# Patient Record
Sex: Female | Born: 1970 | Hispanic: Yes | Marital: Married | Smoking: Never smoker
Health system: Southern US, Community
[De-identification: ages and names within clinical notes are randomized; demographics above are authoritative.]

## PROBLEM LIST (undated history)

## (undated) DIAGNOSIS — E785 Hyperlipidemia, unspecified: Secondary | ICD-10-CM

## (undated) DIAGNOSIS — E119 Type 2 diabetes mellitus without complications: Secondary | ICD-10-CM

## (undated) HISTORY — DX: Type 2 diabetes mellitus without complications: E11.9

## (undated) HISTORY — DX: Hyperlipidemia, unspecified: E78.5

---

## 1992-11-02 HISTORY — PX: BREAST SURGERY: SHX581

## 1998-11-02 HISTORY — PX: ABDOMINAL HYSTERECTOMY: SHX81

## 2005-12-09 ENCOUNTER — Ambulatory Visit: Payer: Self-pay | Admitting: Unknown Physician Specialty

## 2006-01-15 ENCOUNTER — Ambulatory Visit: Payer: Self-pay | Admitting: Unknown Physician Specialty

## 2006-02-11 ENCOUNTER — Ambulatory Visit: Payer: Self-pay | Admitting: Gastroenterology

## 2007-01-19 ENCOUNTER — Ambulatory Visit: Payer: Self-pay | Admitting: General Surgery

## 2007-01-26 ENCOUNTER — Ambulatory Visit: Payer: Self-pay | Admitting: General Surgery

## 2007-06-16 ENCOUNTER — Ambulatory Visit: Payer: Self-pay | Admitting: Gastroenterology

## 2007-06-30 IMAGING — US ABDOMEN ULTRASOUND
1 series · 17 of 25 positions shown · non-contrast
Comparison: none

REASON FOR EXAM: Elevated LFT
COMMENTS:

[Series 1: abdomen ultrasound · 17 of 54 slices shown]
[im 1/54]
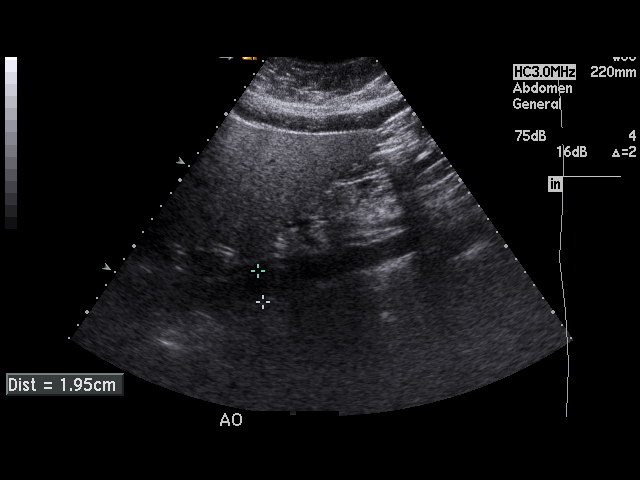
[im 5/54]
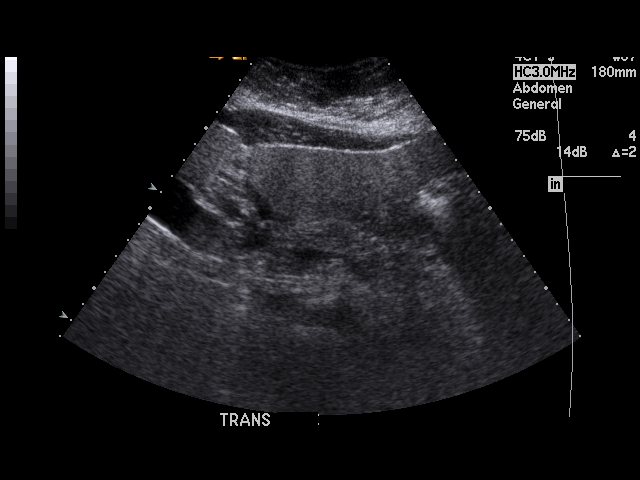
[im 7/54]
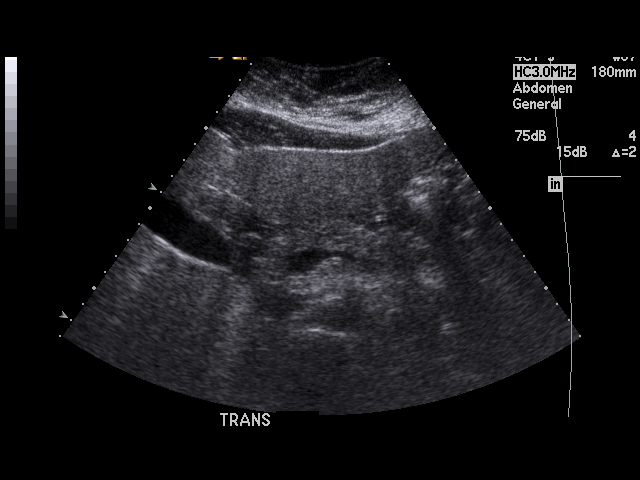
[im 12/54]
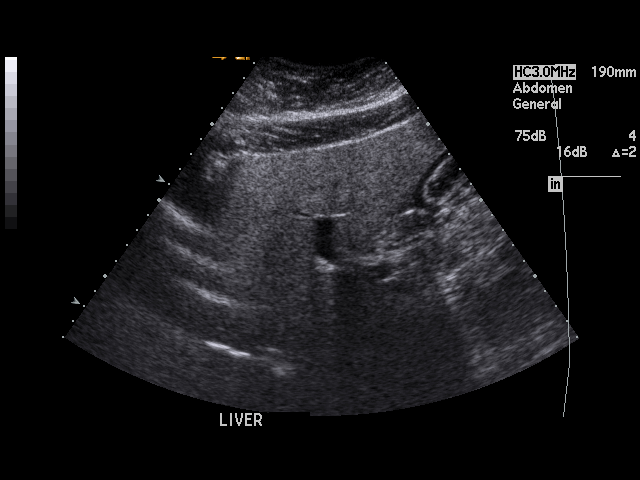
[im 14/54]
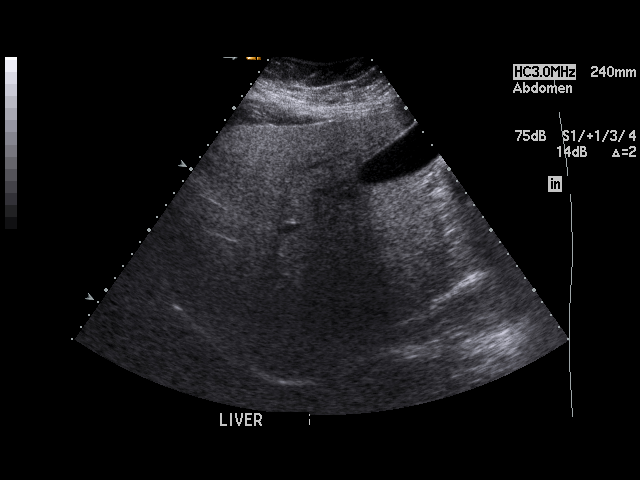
[im 18/54]
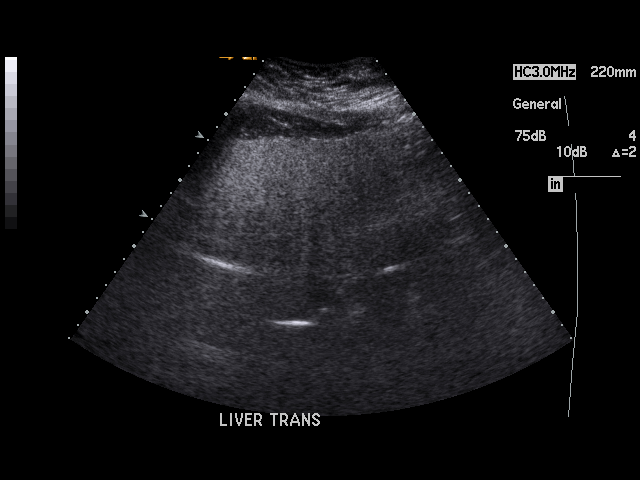
[im 20/54]
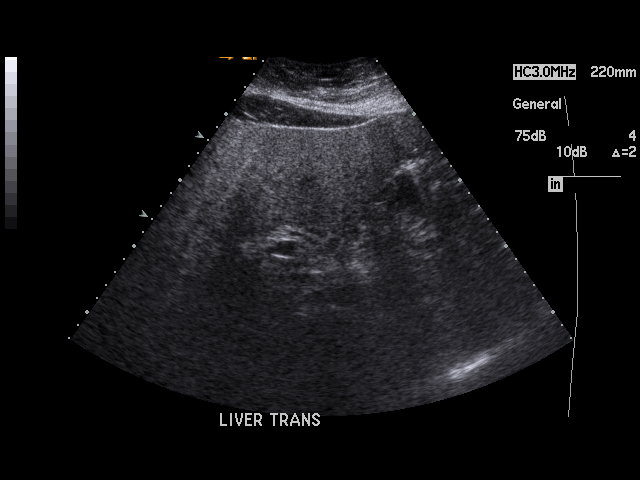
[im 25/54]
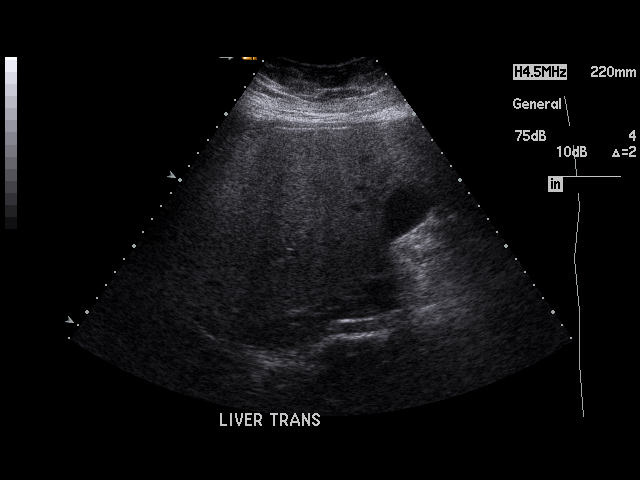
[im 27/54]
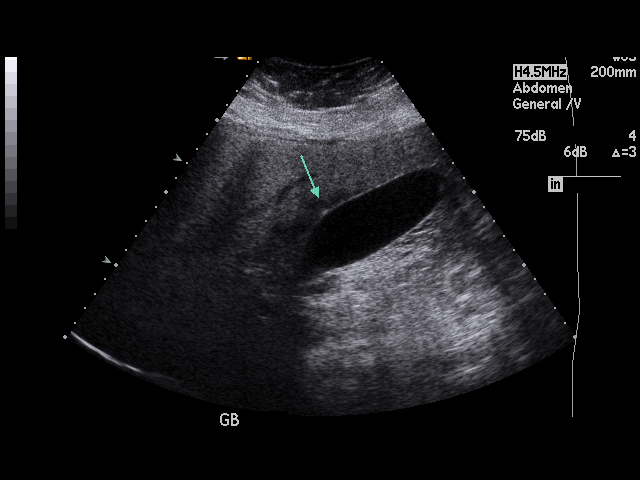
[im 29/54]
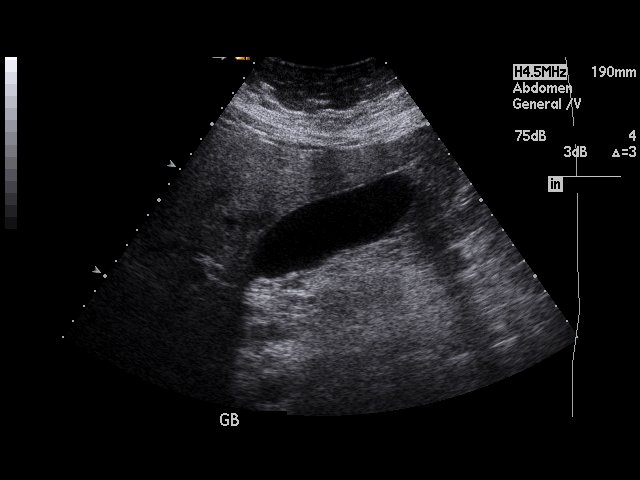
[im 34/54]
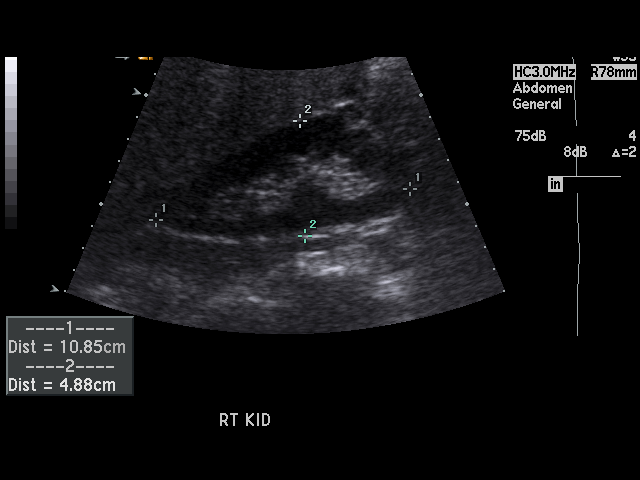
[im 36/54]
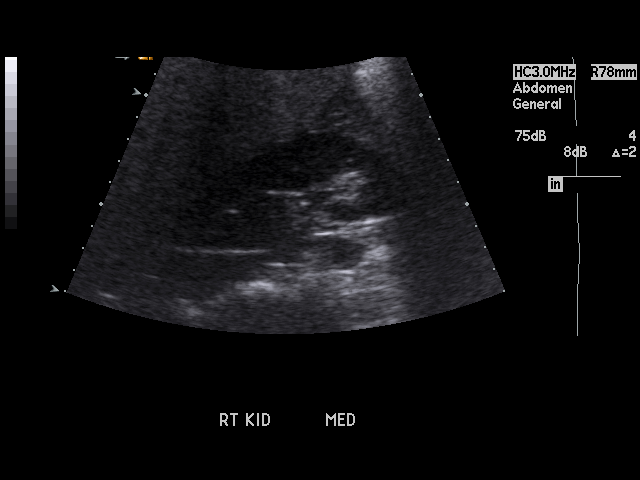
[im 40/54]
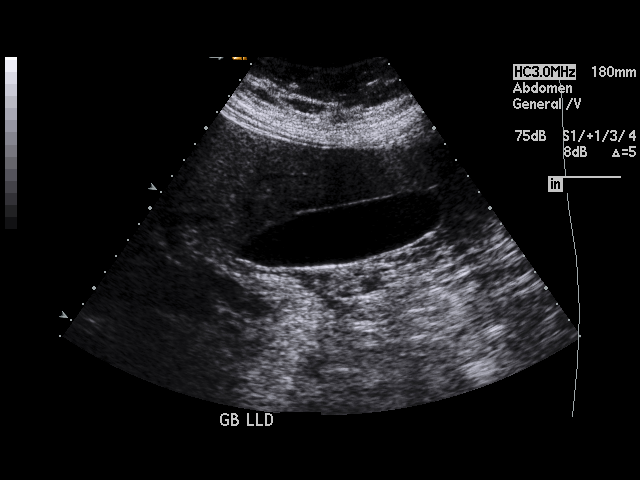
[im 42/54]
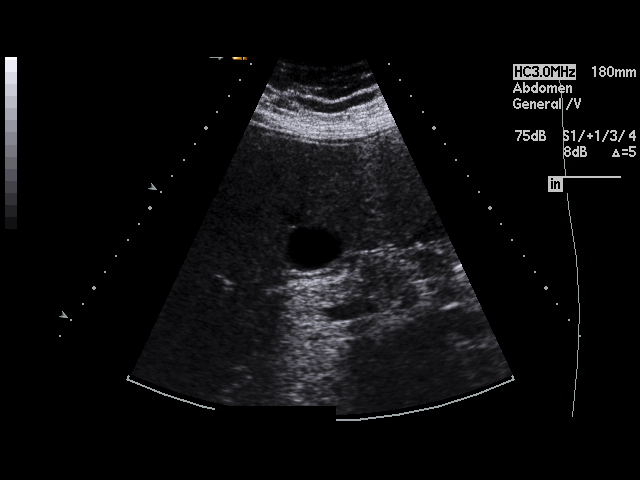
[im 47/54]
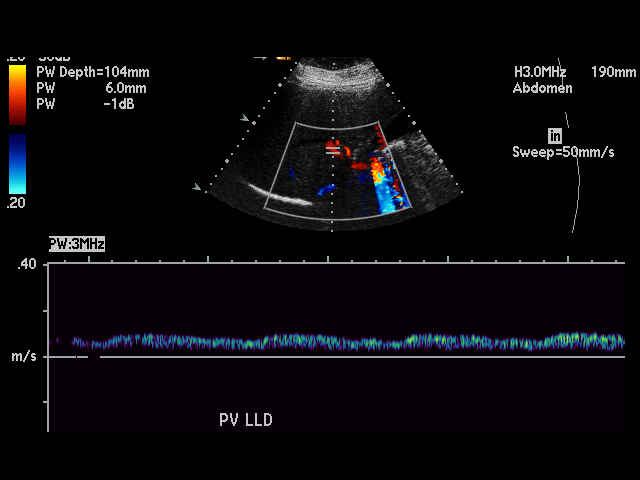
[im 49/54]
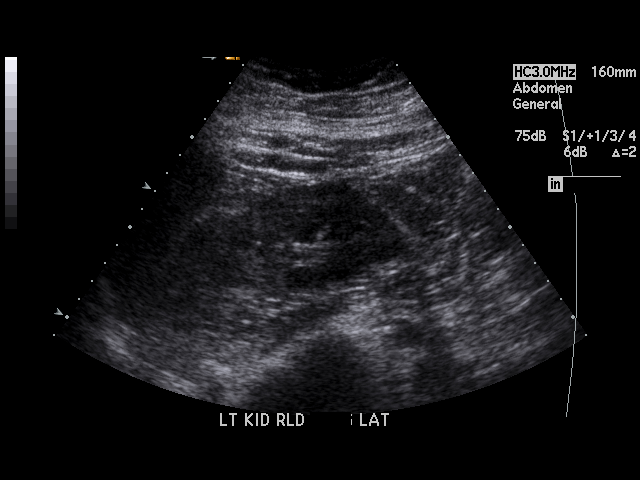
[im 54/54]
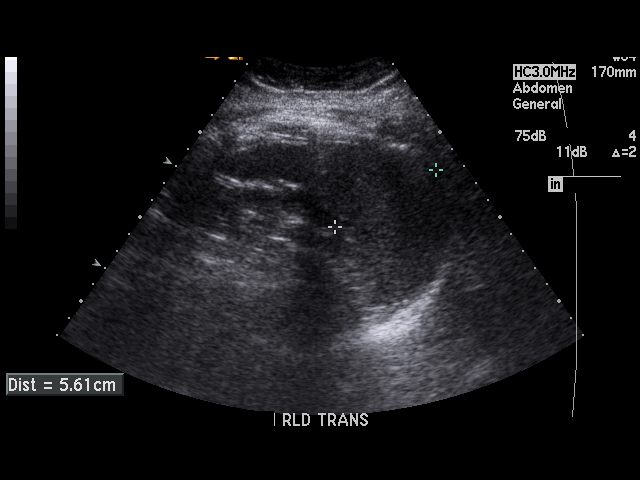

[17 of 25 positions shown; findings below may reference images not displayed]

PROCEDURE:     US  - US ABDOMEN GENERAL SURVEY  - February 11, 2006  [DATE]

RESULT:        The liver is dense, suspicious for fatty infiltration.  The
pancreas is normal in appearance.  The spleen size is normal.  No gallstones
are seen.  There is no thickening of the gallbladder wall.  The common bile
duct measures 3.5 mm in diameter which is within normal limits.  The kidneys
show no hydronephrosis.  The abdominal aorta is normal in appearance.
IMPRESSION: 1.     Possibly fatty infiltration of the liver.
2.     Otherwise normal study.

## 2008-11-01 IMAGING — US ABDOMEN ULTRASOUND
1 series · 17 of 25 positions shown · non-contrast
Comparison: none

REASON FOR EXAM: Elevated LFT's
COMMENTS:

[Series 1: abdomen ultrasound · 17 of 60 slices shown]
[im 1/60]
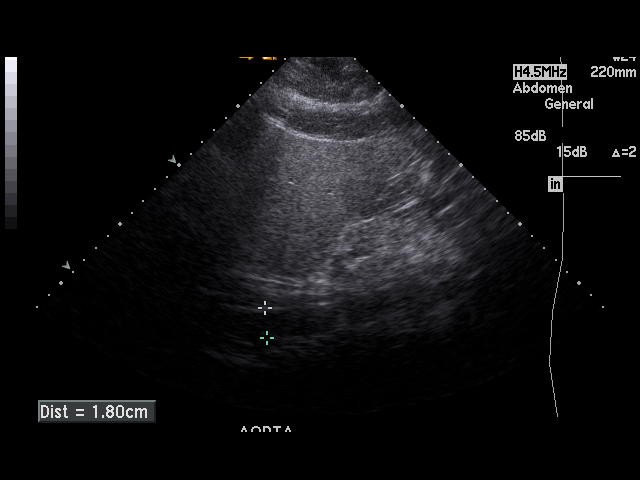
[im 5/60]
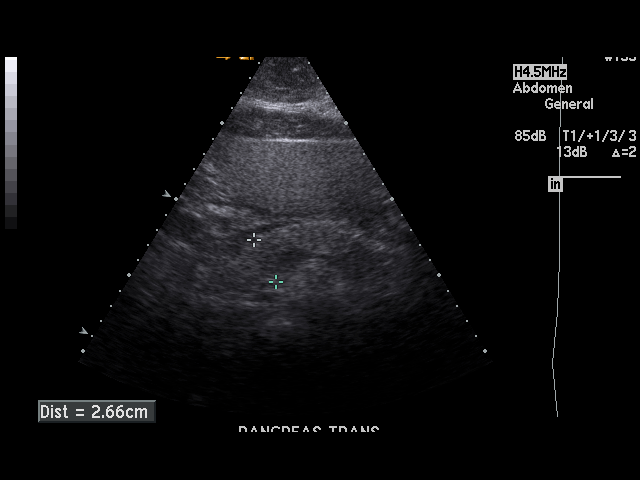
[im 8/60]
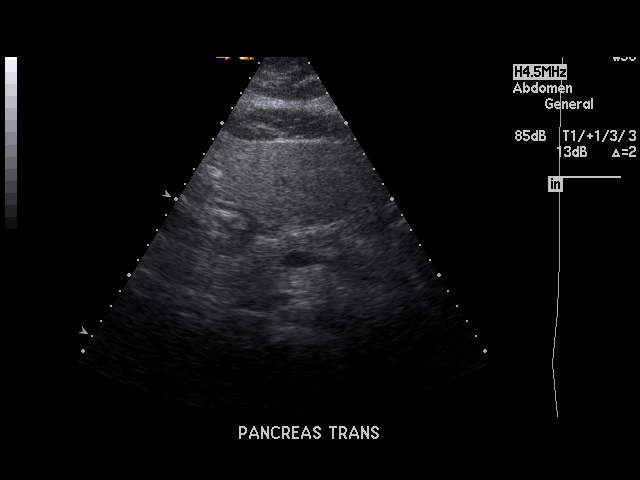
[im 13/60]
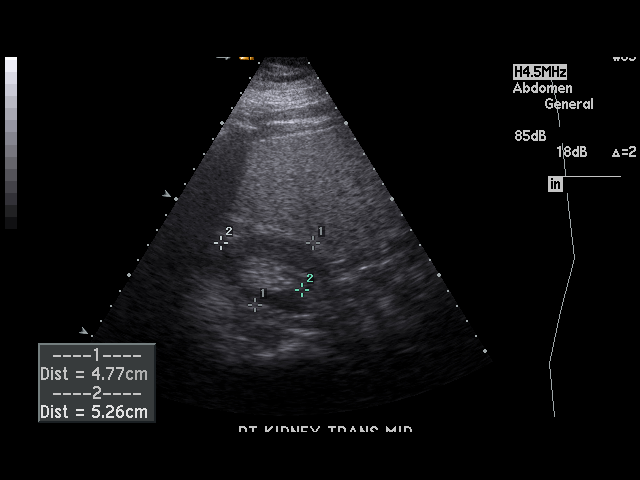
[im 15/60]
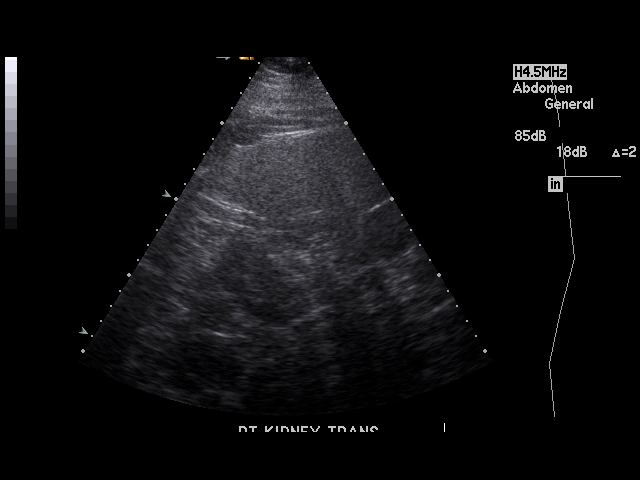
[im 20/60]
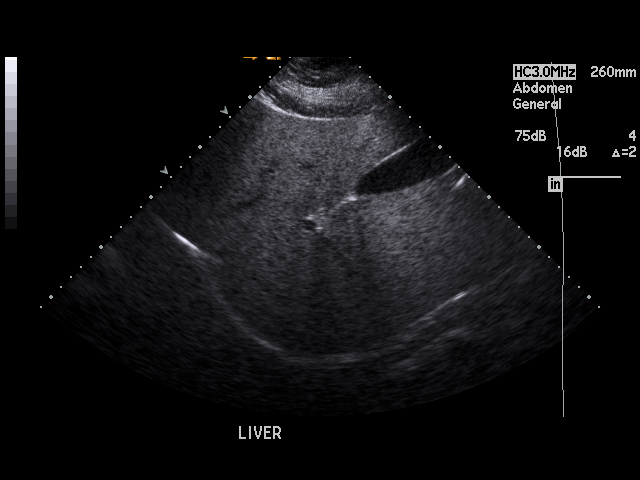
[im 23/60]
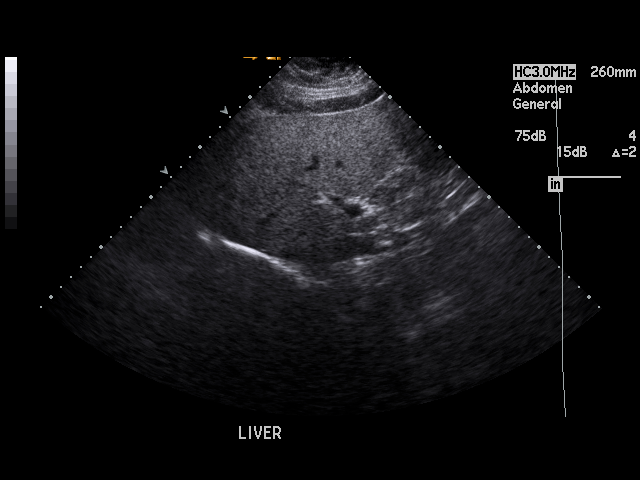
[im 28/60]
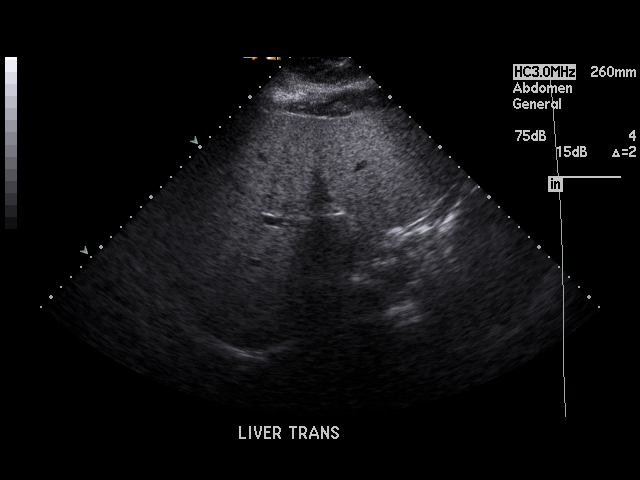
[im 30/60]
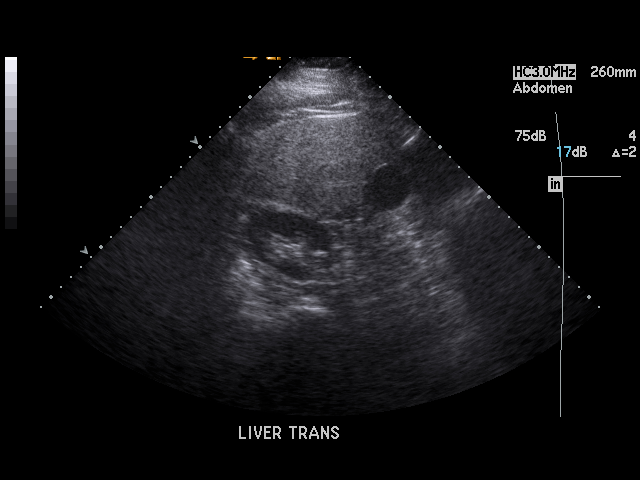
[im 32/60]
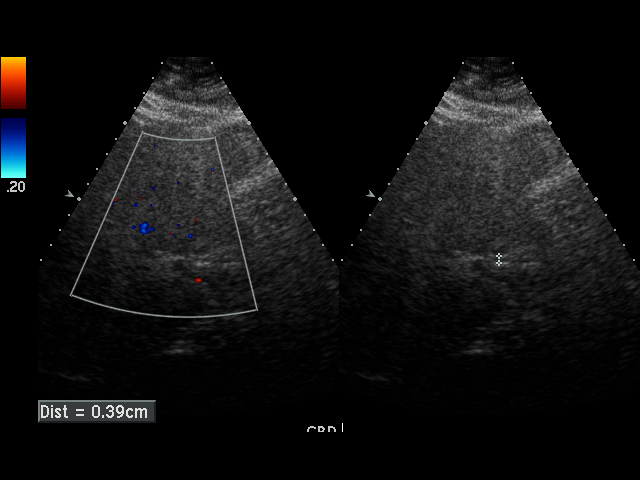
[im 37/60]
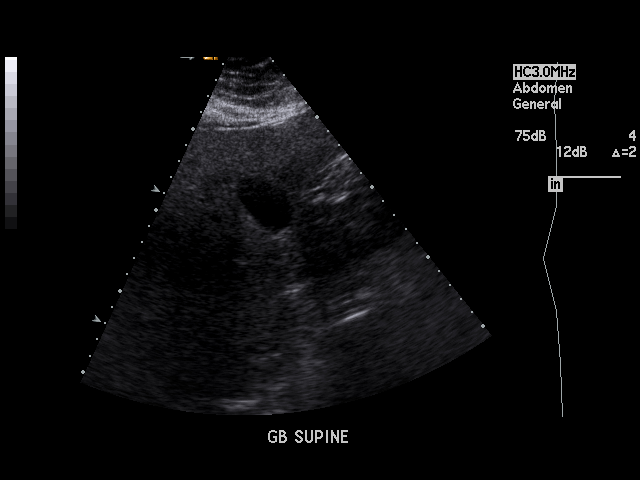
[im 40/60]
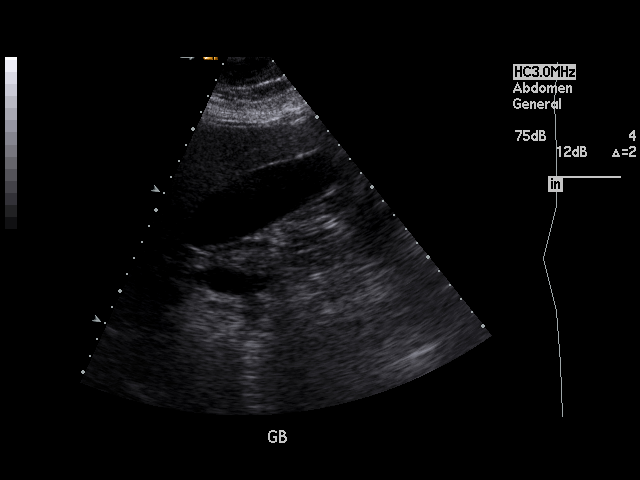
[im 45/60]
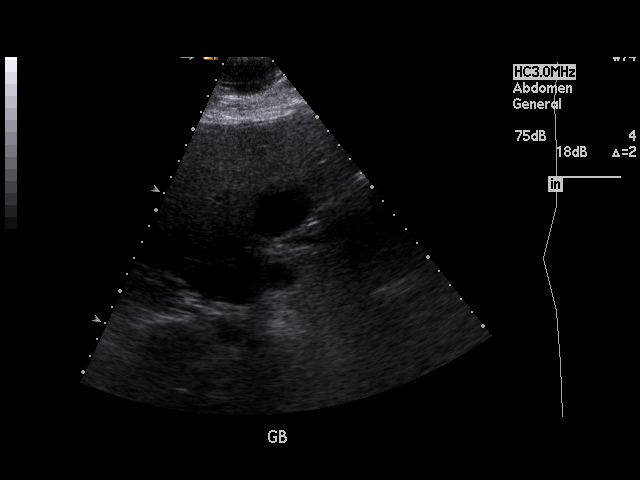
[im 47/60]
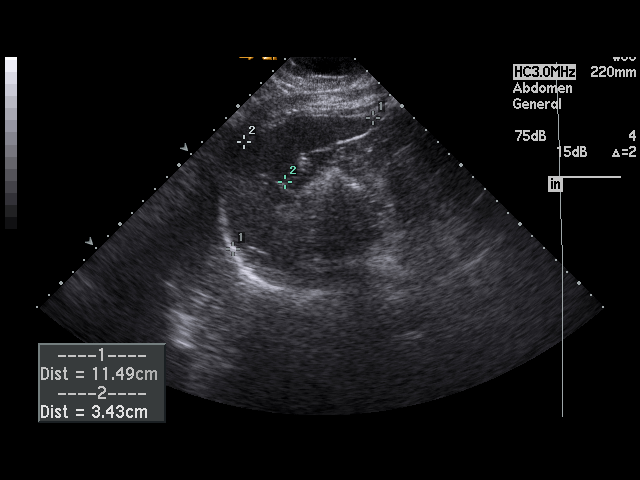
[im 52/60]
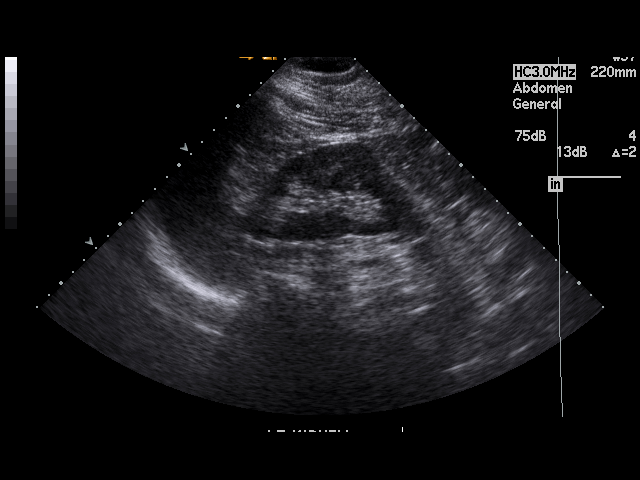
[im 55/60]
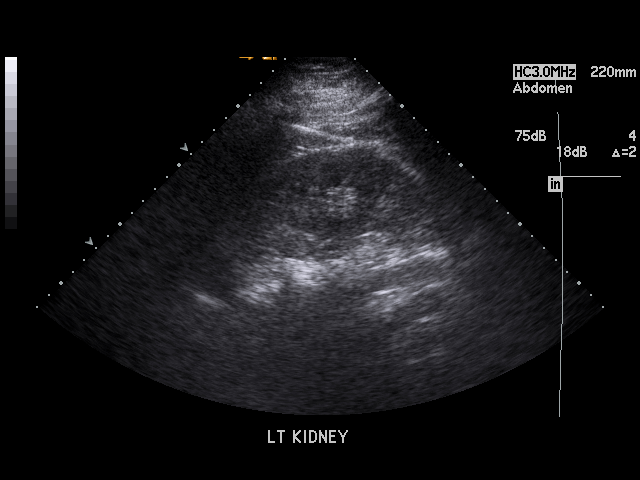
[im 60/60]
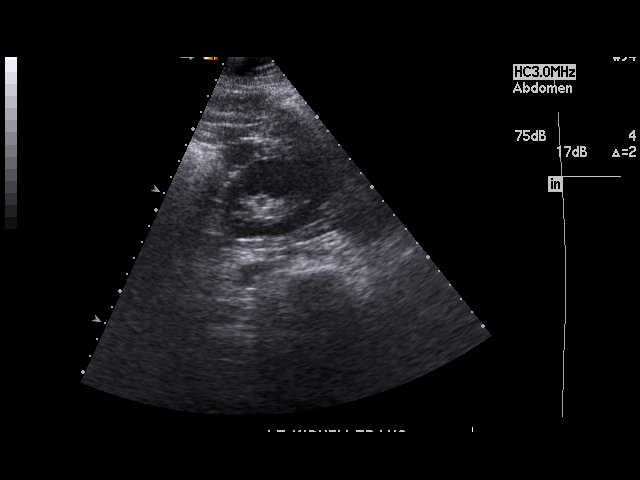

[17 of 25 positions shown; findings below may reference images not displayed]

PROCEDURE:     US  - US ABDOMEN GENERAL SURVEY  - June 16, 2007  [DATE]

RESULT:     The hepatic echo pattern is dense suspicious for fatty
infiltration and similar to the appearance observed on the prior exam of
02/11/2006. No focal hepatic mass lesions are seen. The spleen, pancreas and
abdominal aorta show no significant abnormalities. No gallstones are seen.
There is no thickening of the gallbladder wall. The common bile duct
measures 3.9 mm in diameter which is within normal limits. The kidneys show
no hydronephrosis. There is no ascites.
IMPRESSION: 1.  Probable fatty infiltration of the liver.
2.  No gallstones or other acute changes are identified.

## 2008-11-02 HISTORY — PX: SKIN BIOPSY: SHX1

## 2013-01-27 DIAGNOSIS — Z9884 Bariatric surgery status: Secondary | ICD-10-CM | POA: Insufficient documentation

## 2014-07-10 ENCOUNTER — Emergency Department: Payer: Self-pay | Admitting: Emergency Medicine

## 2014-08-16 DIAGNOSIS — R928 Other abnormal and inconclusive findings on diagnostic imaging of breast: Secondary | ICD-10-CM | POA: Insufficient documentation

## 2015-11-03 HISTORY — PX: LAPAROSCOPIC GASTRIC BAND REMOVAL WITH LAPAROSCOPIC GASTRIC SLEEVE RESECTION: SHX6498

## 2019-01-17 ENCOUNTER — Ambulatory Visit: Payer: Self-pay | Admitting: Podiatry

## 2019-07-07 ENCOUNTER — Encounter: Payer: Self-pay | Admitting: Podiatry

## 2019-07-07 ENCOUNTER — Other Ambulatory Visit: Payer: Self-pay

## 2019-07-07 ENCOUNTER — Ambulatory Visit (INDEPENDENT_AMBULATORY_CARE_PROVIDER_SITE_OTHER): Payer: BC Managed Care – PPO

## 2019-07-07 ENCOUNTER — Ambulatory Visit (INDEPENDENT_AMBULATORY_CARE_PROVIDER_SITE_OTHER): Payer: BC Managed Care – PPO | Admitting: Podiatry

## 2019-07-07 VITALS — BP 120/66 | HR 64 | Resp 16

## 2019-07-07 DIAGNOSIS — M722 Plantar fascial fibromatosis: Secondary | ICD-10-CM

## 2019-07-07 MED ORDER — METHYLPREDNISOLONE 4 MG PO TBPK: ORAL_TABLET | ORAL | 0 refills | Status: DC

## 2019-07-07 MED ORDER — MELOXICAM 15 MG PO TABS: 15.0000 mg | ORAL_TABLET | Freq: Every day | ORAL | 1 refills | Status: DC

## 2019-07-07 NOTE — Patient Instructions (Signed)

## 2019-07-10 NOTE — Progress Notes (Signed)
   Subjective: 48 y.o. female presenting today as a new patient with a chief complaint of aching pain noted to the right plantar heel that began 8 months ago. She reports associated radiating pain to the lateral foot and ankle. She states the pain is worse in the morning and standing and walking increases it throughout the day. She has been using ice therapy, soaking the foot, applying BenGay and using compression socks for treatment. Patient is here for further evaluation and treatment.    No past medical history on file.   Objective: Physical Exam General: The patient is alert and oriented x3 in no acute distress.  Dermatology: Skin is warm, dry and supple bilateral lower extremities. Negative for open lesions or macerations bilateral.   Vascular: Dorsalis Pedis and Posterior Tibial pulses palpable bilateral.  Capillary fill time is immediate to all digits.  Neurological: Epicritic and protective threshold intact bilateral.   Musculoskeletal: Tenderness to palpation to the plantar aspect of the right heel along the plantar fascia. All other joints range of motion within normal limits bilateral. Strength 5/5 in all groups bilateral.   Radiographic exam: Normal osseous mineralization. Joint spaces preserved. No fracture/dislocation/boney destruction. No other soft tissue abnormalities or radiopaque foreign bodies.   Assessment: 1. Plantar fasciitis right  Plan of Care:  1. Patient evaluated. Xrays reviewed.   2. Injection of 0.5cc Celestone soluspan injected into the right plantar fascia  3. Rx for Medrol Dose Pack placed 4. Rx for Meloxicam ordered for patient. 5. Appointment with Liliane Channel, Pedorthist, for custom molded orthotics.  6. Instructed patient regarding therapies and modalities at home to alleviate symptoms.  7. Return to clinic in 4 weeks.    Works 2 jobs on [employer] all day.    Edrick Kins, DPM [facility]  Dr. Edrick Kins, DPM    [address], [place] [address]                Office [phone]  Fax [phone]

## 2019-07-19 ENCOUNTER — Ambulatory Visit (INDEPENDENT_AMBULATORY_CARE_PROVIDER_SITE_OTHER): Payer: BC Managed Care – PPO | Admitting: Orthotics

## 2019-07-19 ENCOUNTER — Encounter: Payer: Self-pay | Admitting: Orthotics

## 2019-07-19 ENCOUNTER — Other Ambulatory Visit: Payer: Self-pay

## 2019-07-19 DIAGNOSIS — M722 Plantar fascial fibromatosis: Secondary | ICD-10-CM | POA: Diagnosis not present

## 2019-07-19 NOTE — Progress Notes (Signed)

## 2019-08-08 ENCOUNTER — Ambulatory Visit (INDEPENDENT_AMBULATORY_CARE_PROVIDER_SITE_OTHER): Payer: BC Managed Care – PPO | Admitting: Podiatry

## 2019-08-08 ENCOUNTER — Encounter: Payer: Self-pay | Admitting: Podiatry

## 2019-08-08 ENCOUNTER — Other Ambulatory Visit: Payer: Self-pay

## 2019-08-08 DIAGNOSIS — M722 Plantar fascial fibromatosis: Secondary | ICD-10-CM | POA: Diagnosis not present

## 2019-08-08 NOTE — Patient Instructions (Signed)

## 2019-08-11 NOTE — Progress Notes (Signed)
   Subjective: 48 y.o. female presenting today for follow up evaluation of plantar fasciitis of the right foot. She reports some continued pain. She has taken the Medrol Dose Pak and Meloxicam as directed. She is due to pick up her custom orthotics today. Being on her feet for long periods of time increases the pain. Patient is here for further evaluation and treatment.    No past medical history on file.   Objective: Physical Exam General: The patient is alert and oriented x3 in no acute distress.  Dermatology: Skin is warm, dry and supple bilateral lower extremities. Negative for open lesions or macerations bilateral.   Vascular: Dorsalis Pedis and Posterior Tibial pulses palpable bilateral.  Capillary fill time is immediate to all digits.  Neurological: Epicritic and protective threshold intact bilateral.   Musculoskeletal: Tenderness to palpation to the plantar aspect of the right heel along the plantar fascia. All other joints range of motion within normal limits bilateral. Strength 5/5 in all groups bilateral.   Assessment: 1. Plantar fasciitis right - improved   Plan of Care:  1. Patient evaluated.  2. Orthotics dispensed today.  3. Continue taking Meloxicam as needed.  4. Recommended good shoe gear.  5. Return to clinic as needed.     Works 2 jobs on [employer] all day.    Edrick Kins, DPM [facility]  Dr. Edrick Kins, DPM    [address], [place] [address]                Office [phone]  Fax [phone]

## 2019-09-08 ENCOUNTER — Other Ambulatory Visit: Payer: Self-pay | Admitting: Podiatry

## 2022-06-23 ENCOUNTER — Ambulatory Visit: Payer: Self-pay | Admitting: Podiatry

## 2022-07-28 ENCOUNTER — Ambulatory Visit: Payer: Self-pay | Admitting: Podiatry

## 2022-12-31 DIAGNOSIS — M5412 Radiculopathy, cervical region: Secondary | ICD-10-CM | POA: Insufficient documentation

## 2023-05-11 ENCOUNTER — Telehealth: Payer: Self-pay | Admitting: Internal Medicine

## 2023-05-11 NOTE — Telephone Encounter (Signed)
MR request faxed to Dr. Katherene Ponto

## 2023-05-27 ENCOUNTER — Telehealth: Payer: Self-pay | Admitting: Internal Medicine

## 2023-05-27 NOTE — Telephone Encounter (Signed)
2nd MR request faxed to Dr. Katherene Ponto

## 2023-05-31 ENCOUNTER — Telehealth: Payer: Self-pay | Admitting: Internal Medicine

## 2023-05-31 NOTE — Telephone Encounter (Signed)
Called Dr. Gregery Na office regarding MR we have not received. # just rings then busy signal-Toni

## 2023-06-01 ENCOUNTER — Telehealth: Payer: Self-pay | Admitting: Internal Medicine

## 2023-06-01 NOTE — Telephone Encounter (Signed)
Received MR from Dr. Patrecia Pace. Will scan once patient has been seen-Toni

## 2023-06-16 ENCOUNTER — Telehealth: Payer: Self-pay | Admitting: Internal Medicine

## 2023-06-16 NOTE — Telephone Encounter (Signed)
Lvm to confirm 06/17/23 appointment-Toni

## 2023-06-17 ENCOUNTER — Ambulatory Visit: Payer: BC Managed Care – PPO | Admitting: Physician Assistant

## 2023-06-17 ENCOUNTER — Encounter: Payer: Self-pay | Admitting: Physician Assistant

## 2023-06-17 VITALS — BP 130/80 | HR 99 | Temp 98.3°F | Resp 16 | Ht 61.0 in | Wt 234.4 lb

## 2023-06-17 DIAGNOSIS — E1165 Type 2 diabetes mellitus with hyperglycemia: Secondary | ICD-10-CM | POA: Diagnosis not present

## 2023-06-17 DIAGNOSIS — E782 Mixed hyperlipidemia: Secondary | ICD-10-CM

## 2023-06-17 DIAGNOSIS — Z7689 Persons encountering health services in other specified circumstances: Secondary | ICD-10-CM

## 2023-06-17 DIAGNOSIS — K219 Gastro-esophageal reflux disease without esophagitis: Secondary | ICD-10-CM

## 2023-06-17 LAB — POCT GLYCOSYLATED HEMOGLOBIN (HGB A1C): Hemoglobin A1C: 6.2 % — AB (ref 4.0–5.6)

## 2023-06-17 NOTE — Progress Notes (Signed)
[facility] [address] [place], [place] [address]  Internal MEDICINE  Office Visit Note  Patient Name: Yolanda Reid  [id number]  [id number]  Date of Service: 06/17/2023   Complaints/HPI Pt is here for establishment of PCP. Chief Complaint  Patient presents with   New Patient (Initial Visit)   Diabetes    Patient has been diagnosed with prediabetes. Taking 1,000mg  Metformin daily at night.   Hyperlipidemia   Quality Metric Gaps    TDAP   HPI Pt is here to establish care -She has been followed by Dr. Patrecia Pace, but he is retiring and she needed a new PCP -Sees GYN, already had pap this year that she said was normal, mammogram will be on Aug 29 -Did have a bone density at some point -taking metformin only once at night, due to nausea. 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 Has been getting injections -Lives with husband and her son -never been a smoker, occasional alcohol--social, no other substance use  Current Medication: Outpatient Encounter Medications as of 06/17/2023  Medication Sig   GLYXAMBI 25-5 MG TABS TAKE 1 TABLET BY MOUTH EVERY DAY IN THE MORNING   meloxicam (MOBIC) 15 MG tablet TAKE 1 TABLET BY MOUTH EVERY DAY   metFORMIN (GLUCOPHAGE) 1000 MG tablet Take 1,000 mg by mouth 2 (two) times daily.   omeprazole (PRILOSEC) 20 MG capsule Take 20 mg by mouth daily.   simvastatin (ZOCOR) 20 MG tablet Take 20 mg by mouth daily.   Vitamin D, Ergocalciferol, (DRISDOL) 1.25 MG (50000 UT) CAPS capsule TAKE 1 CAPSULE BY MOUTH WEEKLY   [DISCONTINUED] esomeprazole (NEXIUM) 20 MG capsule Take 20 mg by mouth daily at 12 noon.   [DISCONTINUED] methylPREDNISolone (MEDROL DOSEPAK) 4 MG  TBPK tablet 6 day dose pack - take as directed   [DISCONTINUED] OneTouch Delica Lancets 33G MISC USE AS DIRECTED ONCE DAILY E11.65   [DISCONTINUED] ONETOUCH ULTRA test strip USE ONCE DAILY E11.65   No facility-administered encounter medications on file as of 06/17/2023.    Surgical History: Past Surgical History:  Procedure Laterality Date   ABDOMINAL HYSTERECTOMY  2000   BREAST SURGERY  1994   CESAREAN SECTION  1995   LAPAROSCOPIC GASTRIC BAND REMOVAL WITH LAPAROSCOPIC GASTRIC SLEEVE RESECTION  2017   SKIN BIOPSY  2010   Right butt cheek    Medical History: Past Medical History:  Diagnosis Date   Hyperlipidemia     Family History: Family History  Problem Relation Age of Onset   Arthritis Mother     Social History   Socioeconomic History   Marital status: Married    Spouse name: Not on file   Number of children: Not on file   Years of education: Not on file   Highest education level: Not on file  Occupational History   Not on file  Tobacco Use   Smoking status: Never   Smokeless tobacco: Never  Substance and Sexual Activity   Alcohol use: Not Currently   Drug use: Never   Sexual activity: Yes  Other Topics Concern   Not on file  Social History Narrative   Not on file   Social Determinants of Health   Financial Resource  Strain: Not on file  Food Insecurity: Not on file  Transportation Needs: Not on file  Physical Activity: Not on file  Stress: Not on file  Social Connections: Not on file  Intimate Partner Violence: Not on file     Review of Systems  Constitutional:  Negative for chills, fatigue and unexpected weight change.  HENT:  Negative for congestion, postnasal drip, rhinorrhea, sneezing and sore throat.   Eyes:  Negative for redness.  Respiratory:  Negative for cough, chest tightness and shortness of breath.   Cardiovascular:  Negative for chest pain and palpitations.  Gastrointestinal:  Negative for abdominal pain, constipation, diarrhea,  nausea and vomiting.  Genitourinary:  Negative for dysuria and frequency.  Musculoskeletal:  Negative for arthralgias, back pain, joint swelling and neck pain.  Skin:  Negative for rash.  Neurological: Negative.  Negative for tremors and numbness.  Hematological:  Negative for adenopathy. Does not bruise/bleed easily.  Psychiatric/Behavioral:  Positive for sleep disturbance. Negative for behavioral problems (Depression) and suicidal ideas. The patient is not nervous/anxious.     Vital Signs: BP 130/80   Pulse 99   Temp 98.3 F (36.8 C)   Resp 16   Ht 5\' 1"  (1.549 m)   Wt 234 lb 6.4 oz (106.3 kg)   SpO2 96%   BMI 44.29 kg/m    Physical Exam Vitals and nursing note reviewed.  Constitutional:      General: She is not in acute distress.    Appearance: Normal appearance. She is well-developed. She is not diaphoretic.  HENT:     Head: Normocephalic and atraumatic.     Mouth/Throat:     Pharynx: No oropharyngeal exudate.  Eyes:     Pupils: Pupils are equal, round, and reactive to light.  Neck:     Thyroid: No thyromegaly.     Vascular: No JVD.     Trachea: No tracheal deviation.  Cardiovascular:     Rate and Rhythm: Normal rate and regular rhythm.     Heart sounds: Normal heart sounds. No murmur heard.    No friction rub. No gallop.  Pulmonary:     Effort: Pulmonary effort is normal. No respiratory distress.     Breath sounds: No wheezing or rales.  Chest:     Chest wall: No tenderness.  Abdominal:     General: Bowel sounds are normal.     Palpations: Abdomen is soft.  Musculoskeletal:        General: Normal range of motion.     Cervical back: Normal range of motion and neck supple.  Lymphadenopathy:     Cervical: No cervical adenopathy.  Skin:    General: Skin is warm and dry.  Neurological:     Mental Status: She is alert and oriented to person, place, and time.     Cranial Nerves: No cranial nerve deficit.  Psychiatric:        Behavior: Behavior normal.         Thought Content: Thought content normal.        Judgment: Judgment normal.       Assessment/Plan: 1. Type 2 diabetes mellitus with hyperglycemia, without long-term current use of insulin (HCC) - POCT HgB A1C is 6.2 which is well controlled, will continue with current medications  2. Mixed hyperlipidemia Continue simvastatin, will request labs from previous provider  3. Gastroesophageal reflux disease, unspecified whether esophagitis present Continue omeprazole as before  4. Encounter to establish care with new doctor Will request further records including recent  labs    General Counseling: Laci verbalizes understanding of the findings of todays visit and agrees with plan of treatment. I have discussed any further diagnostic evaluation that may be needed or ordered today. We also reviewed her medications today. she has been encouraged to call the office with any questions or concerns that should arise related to todays visit.    Counseling:    Orders Placed This Encounter  Procedures   POCT HgB A1C    No orders of the defined types were placed in this encounter.    This patient was seen by Lynn Ito, PA-C in collaboration with Dr. Beverely Risen as a part of collaborative care agreement.   Time spent:35 Minutes

## 2023-07-08 DIAGNOSIS — G5601 Carpal tunnel syndrome, right upper limb: Secondary | ICD-10-CM | POA: Insufficient documentation

## 2023-07-09 ENCOUNTER — Telehealth: Payer: Self-pay

## 2023-07-09 MED ORDER — METFORMIN HCL 1000 MG PO TABS: 1000.0000 mg | ORAL_TABLET | Freq: Two times a day (BID) | ORAL | 3 refills | Status: AC

## 2023-07-09 MED ORDER — GLYXAMBI 25-5 MG PO TABS: 1.0000 | ORAL_TABLET | Freq: Every morning | ORAL | 3 refills | Status: DC

## 2023-07-09 MED ORDER — SIMVASTATIN 20 MG PO TABS: 20.0000 mg | ORAL_TABLET | Freq: Every day | ORAL | 3 refills | Status: DC

## 2023-07-09 MED ORDER — VITAMIN D (ERGOCALCIFEROL) 1.25 MG (50000 UNIT) PO CAPS: 50000.0000 [IU] | ORAL_CAPSULE | ORAL | 3 refills | Status: DC

## 2023-07-09 NOTE — Telephone Encounter (Signed)
Refilled medications

## 2023-08-31 ENCOUNTER — Encounter: Payer: Self-pay | Admitting: Internal Medicine

## 2023-08-31 ENCOUNTER — Encounter: Payer: Self-pay | Admitting: Podiatry

## 2023-08-31 ENCOUNTER — Ambulatory Visit (INDEPENDENT_AMBULATORY_CARE_PROVIDER_SITE_OTHER): Payer: BC Managed Care – PPO | Admitting: Internal Medicine

## 2023-08-31 ENCOUNTER — Ambulatory Visit: Payer: BC Managed Care – PPO | Admitting: Podiatry

## 2023-08-31 ENCOUNTER — Ambulatory Visit (INDEPENDENT_AMBULATORY_CARE_PROVIDER_SITE_OTHER): Payer: BC Managed Care – PPO

## 2023-08-31 VITALS — Ht 61.0 in | Wt 226.0 lb

## 2023-08-31 VITALS — BP 120/90 | HR 94 | Temp 98.2°F | Resp 16 | Ht 61.0 in | Wt 226.6 lb

## 2023-08-31 DIAGNOSIS — R1013 Epigastric pain: Secondary | ICD-10-CM

## 2023-08-31 DIAGNOSIS — R1112 Projectile vomiting: Secondary | ICD-10-CM

## 2023-08-31 DIAGNOSIS — M722 Plantar fascial fibromatosis: Secondary | ICD-10-CM | POA: Diagnosis not present

## 2023-08-31 DIAGNOSIS — E1165 Type 2 diabetes mellitus with hyperglycemia: Secondary | ICD-10-CM

## 2023-08-31 DIAGNOSIS — M79672 Pain in left foot: Secondary | ICD-10-CM | POA: Diagnosis not present

## 2023-08-31 DIAGNOSIS — Z903 Acquired absence of stomach [part of]: Secondary | ICD-10-CM

## 2023-08-31 LAB — POCT URINALYSIS DIPSTICK
Blood, UA: NEGATIVE
Glucose, UA: NEGATIVE
Nitrite, UA: NEGATIVE
Protein, UA: POSITIVE — AB
Spec Grav, UA: 1.025 (ref 1.010–1.025)
Urobilinogen, UA: 0.2 U/dL
pH, UA: 5 (ref 5.0–8.0)

## 2023-08-31 MED ORDER — MELOXICAM 15 MG PO TABS: 15.0000 mg | ORAL_TABLET | Freq: Every day | ORAL | 1 refills | Status: AC

## 2023-08-31 MED ORDER — BETAMETHASONE SOD PHOS & ACET 6 (3-3) MG/ML IJ SUSP
3.0000 mg | Freq: Once | INTRAMUSCULAR | Status: AC
  Administered 2023-08-31: 3 mg via INTRA_ARTICULAR

## 2023-08-31 MED ORDER — OMEPRAZOLE 20 MG PO CPDR
20.0000 mg | DELAYED_RELEASE_CAPSULE | Freq: Two times a day (BID) | ORAL | 6 refills | Status: DC
Start: 2023-08-31 — End: 2023-12-02

## 2023-08-31 MED ORDER — METHYLPREDNISOLONE 4 MG PO TBPK: ORAL_TABLET | ORAL | 0 refills | Status: DC

## 2023-08-31 MED ORDER — ONDANSETRON HCL 4 MG PO TABS: 4.0000 mg | ORAL_TABLET | Freq: Three times a day (TID) | ORAL | 0 refills | Status: AC | PRN

## 2023-08-31 NOTE — Progress Notes (Signed)
[facility] [address] [place], [place] [address]  Internal MEDICINE  Office Visit Note  Patient Name: Yolanda Reid  [id number]  [id number]  Date of Service: 08/31/2023  Chief Complaint  Patient presents with   Acute Visit   Abdominal Pain    Began over the weekend, burning sensation in stomach, no appetite. Vomited when trying to eat this morning, even water seems to upset stomach.    HPI Pt is seen for acute visit Abdominal pain with epigastric burning, she has been at work, started to have nausea She vomited once  Has h/o gastric sleeve, unable t lose wt, takes omeprazole on a regular basis     Current Medication: Outpatient Encounter Medications as of 08/31/2023  Medication Sig   GLYXAMBI 25-5 MG TABS Take 1 tablet by mouth every morning.   metFORMIN (GLUCOPHAGE) 1000 MG tablet Take 1 tablet (1,000 mg total) by mouth 2 (two) times daily.   ondansetron (ZOFRAN) 4 MG tablet Take 1 tablet (4 mg total) by mouth every 8 (eight) hours as needed for nausea or vomiting.   simvastatin (ZOCOR) 20 MG tablet Take 1 tablet (20 mg total) by mouth daily.   Vitamin D, Ergocalciferol, (DRISDOL) 1.25 MG (50000 UNIT) CAPS capsule Take 1 capsule (50,000 Units total) by mouth once a week.   [DISCONTINUED] meloxicam (MOBIC) 15 MG tablet TAKE 1 TABLET BY MOUTH EVERY DAY   [DISCONTINUED] omeprazole (PRILOSEC) 20 MG capsule Take 20 mg by mouth daily.   omeprazole (PRILOSEC) 20 MG capsule Take 1 capsule (20 mg total) by mouth 2 (two) times daily before a meal.   No facility-administered encounter medications on file as of 08/31/2023.    Surgical History: Past Surgical History:  Procedure Laterality Date   ABDOMINAL HYSTERECTOMY  2000   BREAST SURGERY  1994   CESAREAN SECTION  1995   LAPAROSCOPIC GASTRIC BAND REMOVAL WITH LAPAROSCOPIC GASTRIC SLEEVE RESECTION  2017   SKIN BIOPSY  2010   Right butt cheek    Medical History: Past Medical History:  Diagnosis Date    Hyperlipidemia     Family History: Family History  Problem Relation Age of Onset   Arthritis Mother     Social History   Socioeconomic History   Marital status: Married    Spouse name: Not on file   Number of children: Not on file   Years of education: Not on file   Highest education level: Not on file  Occupational History   Not on file  Tobacco Use   Smoking status: Never   Smokeless tobacco: Never  Substance and Sexual Activity   Alcohol use: Not Currently   Drug use: Never   Sexual activity: Yes  Other Topics Concern   Not on file  Social History Narrative   Not on file   Social Determinants of Health   Financial Resource Strain: Not on file  Food Insecurity: Not on file  Transportation Needs: Not on file  Physical Activity: Not on file  Stress: Not on file  Social Connections: Not on file  Intimate Partner Violence: Not on file      Review of Systems  Constitutional:  Negative for fatigue and fever.  HENT:  Negative for congestion, mouth sores and postnasal drip.   Respiratory:  Negative for cough.   Cardiovascular:  Negative for chest pain.  Gastrointestinal:  Positive for abdominal pain and nausea.  Genitourinary:  Negative for flank pain.  Psychiatric/Behavioral: Negative.      Vital Signs:  BP (!) 120/90   Pulse 94   Temp 98.2 F (36.8 C)   Resp 16   Ht 5\' 1"  (1.549 m)   Wt 226 lb 9.6 oz (102.8 kg)   SpO2 96%   BMI 42.82 kg/m    Physical Exam Constitutional:      Appearance: Normal appearance.  HENT:     Head: Normocephalic and atraumatic.     Nose: Nose normal.     Mouth/Throat:     Mouth: Mucous membranes are moist.     Pharynx: No posterior oropharyngeal erythema.  Eyes:     Extraocular Movements: Extraocular movements intact.     Pupils: Pupils are equal, round, and reactive to light.  Cardiovascular:     Pulses: Normal pulses.     Heart sounds: Normal heart sounds.  Pulmonary:     Effort: Pulmonary effort is normal.      Breath sounds: Normal breath sounds.  Abdominal:     Tenderness: There is abdominal tenderness in the epigastric area. Positive signs include Murphy's sign.  Neurological:     General: No focal deficit present.     Mental Status: She is alert.  Psychiatric:        Mood and Affect: Mood normal.        Behavior: Behavior normal.        Assessment/Plan: 1. Epigastric pain High risk for PUD due to gastric sleeve, will explore further, Increase omeprazole to 2 xaday, need to look into gallstones as well  - POCT Urinalysis Dipstick - omeprazole (PRILOSEC) 20 MG capsule; Take 1 capsule (20 mg total) by mouth 2 (two) times daily before a meal.  Dispense: 60 capsule; Refill: 6 - US Abdomen Complete; Future - DG UGI W DOUBLE CM (HD BA); Future  2. Projectile vomiting with nausea Add Zofran prn - US Abdomen Complete; Future - DG UGI W DOUBLE CM (HD BA); Future  3. H/O gastric sleeve Monitor, app labs are ordered    4. Morbid obesity (HCC) S/p gastric sleeve, needs labs including ferritin and b12   5. Type 2 diabetes mellitus with hyperglycemia, without long-term current use of insulin (HCC) Continue current meds for now  - Iron, TIBC and Ferritin Panel  General Counseling: Yolanda Reid verbalizes understanding of the findings of todays visit and agrees with plan of treatment. I have discussed any further diagnostic evaluation that may be needed or ordered today. We also reviewed her medications today. she has been encouraged to call the office with any questions or concerns that should arise related to todays visit.    Orders Placed This Encounter  Procedures   US Abdomen Complete   DG UGI W DOUBLE CM (HD BA)   CBC with Differential/Platelet   Lipid Panel With LDL/HDL Ratio   TSH   T4, free   Comprehensive metabolic panel   Urinalysis   Urine Microalbumin w/creat. ratio   POCT Urinalysis Dipstick    Meds ordered this encounter  Medications   ondansetron (ZOFRAN) 4 MG tablet     Sig: Take 1 tablet (4 mg total) by mouth every 8 (eight) hours as needed for nausea or vomiting.    Dispense:  45 tablet    Refill:  0   omeprazole (PRILOSEC) 20 MG capsule    Sig: Take 1 capsule (20 mg total) by mouth 2 (two) times daily before a meal.    Dispense:  60 capsule    Refill:  6    Total time spent:35 Minutes Time spent  includes review of chart, medications, test results, and follow up plan with the patient.   [place] Controlled Substance Database was reviewed by me.   Dr Lyndon Code Internal medicine

## 2023-08-31 NOTE — Progress Notes (Signed)
   Chief Complaint  Patient presents with   Foot Pain    Patient is here for left foot heel pain    Subjective: 52 y.o. female presenting today as a reestablish new patient for evaluation of left heel pain ongoing for few months now.  Idiopathic onset.  She does have a history of plantar fasciitis to the right foot and last seen here in our office 08/08/2019.  She says that the right foot is doing very well.   Past Medical History:  Diagnosis Date   Hyperlipidemia    Past Surgical History:  Procedure Laterality Date   ABDOMINAL HYSTERECTOMY  2000   BREAST SURGERY  1994   CESAREAN SECTION  1995   LAPAROSCOPIC GASTRIC BAND REMOVAL WITH LAPAROSCOPIC GASTRIC SLEEVE RESECTION  2017   SKIN BIOPSY  2010   Right butt cheek   No Known Allergies   Objective: Physical Exam General: The patient is alert and oriented x3 in no acute distress.  Dermatology: Skin is warm, dry and supple bilateral lower extremities. Negative for open lesions or macerations bilateral.   Vascular: Dorsalis Pedis and Posterior Tibial pulses palpable bilateral.  Capillary fill time is immediate to all digits.  Neurological: Grossly intact via light touch  Musculoskeletal: Tenderness to palpation to the plantar aspect of the left heel along the plantar fascia. All other joints range of motion within normal limits bilateral. Strength 5/5 in all groups bilateral.   Radiographic exam LT foot 08/31/2023: Normal osseous mineralization. Joint spaces preserved. No fracture/dislocation/boney destruction. No other soft tissue abnormalities or radiopaque foreign bodies.  Plantar and posterior heel spur noted  Assessment: 1. Plantar fasciitis left foot  Plan of Care:  -Patient evaluated. Xrays reviewed.   -Injection of 0.5cc Celestone soluspan injected into the left plantar fascia.  -Rx for Medrol Dose Pak placed -Prescription for meloxicam 15 mg daily -Continue good supportive [employer] N[employer]lip shoes w/ Foot Levelers  Insoles -Stressed the importance of daily stretching exercises specifically calf stretches which were demonstrated today -Return to clinic 4 weeks  *Engineer, petroleum at [employer]  Felecia Shelling, [place] [facility]  Dr. Felecia Shelling, DPM    [address] [place], [place] [address]                Office [phone]  Fax [phone]

## 2023-09-02 ENCOUNTER — Other Ambulatory Visit: Payer: Self-pay | Admitting: Internal Medicine

## 2023-09-02 DIAGNOSIS — R1013 Epigastric pain: Secondary | ICD-10-CM

## 2023-09-02 DIAGNOSIS — R1112 Projectile vomiting: Secondary | ICD-10-CM

## 2023-09-09 ENCOUNTER — Ambulatory Visit
Admission: RE | Admit: 2023-09-09 | Discharge: 2023-09-09 | Disposition: A | Discharge status: Home / Self Care | Payer: BC Managed Care – PPO | Source: Ambulatory Visit | Attending: Internal Medicine | Admitting: Internal Medicine

## 2023-09-09 DIAGNOSIS — R1013 Epigastric pain: Secondary | ICD-10-CM

## 2023-09-09 DIAGNOSIS — R1112 Projectile vomiting: Secondary | ICD-10-CM

## 2023-09-28 ENCOUNTER — Encounter: Payer: Self-pay | Admitting: Podiatry

## 2023-09-28 ENCOUNTER — Ambulatory Visit: Payer: BC Managed Care – PPO | Admitting: Podiatry

## 2023-09-28 VITALS — Ht 61.0 in | Wt 226.0 lb

## 2023-09-28 DIAGNOSIS — M722 Plantar fascial fibromatosis: Secondary | ICD-10-CM | POA: Diagnosis not present

## 2023-09-28 MED ORDER — BETAMETHASONE SOD PHOS & ACET 6 (3-3) MG/ML IJ SUSP
3.0000 mg | Freq: Once | INTRAMUSCULAR | Status: AC
  Administered 2023-09-28: 3 mg via INTRA_ARTICULAR

## 2023-09-28 NOTE — Progress Notes (Signed)
   Chief Complaint  Patient presents with   Foot Pain    Patient is here for F/U for heel pain, patient states injection did not help and it hurts a lot    Subjective: 52 y.o. female presenting today for follow-up evaluation of plantar fasciitis to the left foot.  Patient continues to have pain and tenderness to the left foot.  She works 2 jobs and is on her feet 10-12 hours/day.  She says the injection helped minimally.  She continues to have pain.  She believes that some of her pain was associated to the Foot Levelers insoles that she was wearing.  She has since discontinued with mild improvement  Past Medical History:  Diagnosis Date   Hyperlipidemia    Past Surgical History:  Procedure Laterality Date   ABDOMINAL HYSTERECTOMY  2000   BREAST SURGERY  1994   CESAREAN SECTION  1995   LAPAROSCOPIC GASTRIC BAND REMOVAL WITH LAPAROSCOPIC GASTRIC SLEEVE RESECTION  2017   SKIN BIOPSY  2010   Right butt cheek   No Known Allergies   Objective: Physical Exam General: The patient is alert and oriented x3 in no acute distress.  Dermatology: Skin is warm, dry and supple bilateral lower extremities. Negative for open lesions or macerations bilateral.   Vascular: Dorsalis Pedis and Posterior Tibial pulses palpable bilateral.  Capillary fill time is immediate to all digits.  Neurological: Grossly intact via light touch  Musculoskeletal: There continues to be tenderness to palpation to the plantar aspect of the left heel along the plantar fascia. All other joints range of motion within normal limits bilateral. Strength 5/5 in all groups bilateral.   Radiographic exam LT foot 08/31/2023: Normal osseous mineralization. Joint spaces preserved. No fracture/dislocation/boney destruction. No other soft tissue abnormalities or radiopaque foreign bodies.  Plantar and posterior heel spur noted  Assessment: 1. Plantar fasciitis left foot 2.  Posterior and plantar heel spur left  Plan of Care:   -Patient evaluated.  -Injection of 0.5cc Celestone soluspan injected into the left plantar fascia.  - Continue meloxicam 15 mg daily -Continue good supportive Sketchers Nonslip shoes  -Discontinue Foot Levelers insoles.  OTC power step insoles were dispensed.  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 Felecia Shelling, DPM    [address] [place], [place] [address]                Office [phone]  Fax [phone]

## 2023-10-21 ENCOUNTER — Encounter: Payer: Self-pay | Admitting: Physician Assistant

## 2023-10-21 ENCOUNTER — Ambulatory Visit: Payer: BC Managed Care – PPO | Admitting: Physician Assistant

## 2023-10-21 VITALS — BP 110/70 | HR 67 | Temp 98.3°F | Resp 16 | Ht 63.0 in | Wt 233.2 lb

## 2023-10-21 DIAGNOSIS — R1013 Epigastric pain: Secondary | ICD-10-CM | POA: Diagnosis not present

## 2023-10-21 DIAGNOSIS — K760 Fatty (change of) liver, not elsewhere classified: Secondary | ICD-10-CM | POA: Diagnosis not present

## 2023-10-21 DIAGNOSIS — E1165 Type 2 diabetes mellitus with hyperglycemia: Secondary | ICD-10-CM

## 2023-10-21 DIAGNOSIS — K219 Gastro-esophageal reflux disease without esophagitis: Secondary | ICD-10-CM

## 2023-10-21 LAB — POCT GLYCOSYLATED HEMOGLOBIN (HGB A1C): Hemoglobin A1C: 6.4 % — AB (ref 4.0–5.6)

## 2023-10-21 MED ORDER — VITAMIN D (ERGOCALCIFEROL) 1.25 MG (50000 UNIT) PO CAPS: 50000.0000 [IU] | ORAL_CAPSULE | ORAL | 3 refills | Status: DC

## 2023-10-21 MED ORDER — ACCU-CHEK GUIDE ME W/DEVICE KIT: PACK | 0 refills | Status: DC

## 2023-10-21 NOTE — Progress Notes (Signed)
[facility] [address] [place], [place] [address]  Internal MEDICINE  Office Visit Note  Patient Name: Yolanda Reid  [id number]  [id number]  Date of Service: 10/21/2023  No chief complaint on file.   HPI Pt is here for routine follow up -Recently on ABX for ear infection and finished yesterday and is doing better -Taking omeprazole once per day and is working well. No abdominal pain. Has found spicy/acidic foods trigger and avoid this or will take second dose if needed. States symptoms did not last long after last acute visit.  -Did have US showing fatty liver, will have labs to check enzymes now -upper GI never got scheduled but pt denies any concerns now and low dose PPI helping. May need to schedule in future -Needs new meter to check BG -Will have all labs and microalbumin previously ordered done--given slip -will schedule CPE, had pap with GYN earlier this year. Mammo done  Current Medication: Outpatient Encounter Medications as of 10/21/2023  Medication Sig   Blood Glucose Monitoring Suppl (ACCU-CHEK GUIDE ME) w/Device KIT Use as directed. E11.65   GLYXAMBI 25-5 MG TABS Take 1 tablet by mouth every morning.   meloxicam (MOBIC) 15 MG tablet Take 1 tablet (15 mg total) by mouth daily.   metFORMIN (GLUCOPHAGE) 1000 MG tablet Take 1 tablet (1,000 mg total) by mouth 2 (two) times daily.   methylPREDNISolone (MEDROL DOSEPAK) 4 MG TBPK tablet 6 day dose pack - take as directed   omeprazole (PRILOSEC) 20 MG capsule Take 1 capsule (20 mg total) by mouth 2 (two) times daily before a meal.   ondansetron (ZOFRAN) 4 MG tablet Take 1 tablet (4 mg total) by mouth every 8 (eight) hours as needed for nausea or vomiting.   simvastatin (ZOCOR) 20 MG tablet Take 1 tablet (20 mg total) by mouth daily.   [DISCONTINUED] Vitamin D, Ergocalciferol, (DRISDOL) 1.25 MG (50000 UNIT) CAPS capsule Take 1 capsule (50,000 Units total) by mouth once a week.   Vitamin D, Ergocalciferol,  (DRISDOL) 1.25 MG (50000 UNIT) CAPS capsule Take 1 capsule (50,000 Units total) by mouth once a week.   No facility-administered encounter medications on file as of 10/21/2023.    Surgical History: Past Surgical History:  Procedure Laterality Date   ABDOMINAL HYSTERECTOMY  2000   BREAST SURGERY  1994   CESAREAN SECTION  1995   LAPAROSCOPIC GASTRIC BAND REMOVAL WITH LAPAROSCOPIC GASTRIC SLEEVE RESECTION  2017   SKIN BIOPSY  2010   Right butt cheek    Medical History: Past Medical History:  Diagnosis Date   Hyperlipidemia     Family History: Family History  Problem Relation Age of Onset   Arthritis Mother     Social History   Socioeconomic History   Marital status: Married    Spouse name: Not on file   Number of children: Not on file   Years of education: Not on file   Highest education level: Not on file  Occupational History   Not on file  Tobacco Use   Smoking status: Never   Smokeless tobacco: Never  Substance and Sexual Activity   Alcohol use: Not Currently   Drug use: Never   Sexual activity: Yes  Other Topics Concern   Not on file  Social History Narrative   Not on file   Social Drivers of Health   Financial Resource Strain: Not on file  Food Insecurity: Not on file  Transportation Needs: Not on file  Physical Activity: Not on  file  Stress: Not on file  Social Connections: Not on file  Intimate Partner Violence: Not on file      Review of Systems  Constitutional:  Negative for chills, fatigue and unexpected weight change.  HENT:  Negative for congestion, postnasal drip, rhinorrhea, sneezing and sore throat.   Eyes:  Negative for redness.  Respiratory:  Negative for cough, chest tightness and shortness of breath.   Cardiovascular:  Negative for chest pain and palpitations.  Gastrointestinal:  Negative for abdominal pain, constipation, diarrhea, nausea and vomiting.  Genitourinary:  Negative for dysuria and frequency.  Musculoskeletal:   Negative for arthralgias, back pain, joint swelling and neck pain.  Skin:  Negative for rash.  Neurological: Negative.  Negative for tremors and numbness.  Hematological:  Negative for adenopathy. Does not bruise/bleed easily.  Psychiatric/Behavioral:  Negative for behavioral problems (Depression) and suicidal ideas. The patient is not nervous/anxious.     Vital Signs: BP 110/70   Pulse 67   Temp 98.3 F (36.8 C)   Resp 16   Ht 5\' 3"  (1.6 m)   Wt 233 lb 3.2 oz (105.8 kg)   SpO2 97%   BMI 41.31 kg/m    Physical Exam Vitals and nursing note reviewed.  Constitutional:      General: She is not in acute distress.    Appearance: Normal appearance. She is well-developed. She is not diaphoretic.  HENT:     Head: Normocephalic and atraumatic.     Mouth/Throat:     Pharynx: No oropharyngeal exudate.  Eyes:     Pupils: Pupils are equal, round, and reactive to light.  Neck:     Thyroid: No thyromegaly.     Vascular: No JVD.     Trachea: No tracheal deviation.  Cardiovascular:     Rate and Rhythm: Normal rate and regular rhythm.     Heart sounds: Normal heart sounds. No murmur heard.    No friction rub. No gallop.  Pulmonary:     Effort: Pulmonary effort is normal. No respiratory distress.     Breath sounds: No wheezing or rales.  Chest:     Chest wall: No tenderness.  Abdominal:     General: Bowel sounds are normal.     Palpations: Abdomen is soft.  Musculoskeletal:        General: Normal range of motion.     Cervical back: Normal range of motion and neck supple.  Lymphadenopathy:     Cervical: No cervical adenopathy.  Skin:    General: Skin is warm and dry.  Neurological:     Mental Status: She is alert and oriented to person, place, and time.     Cranial Nerves: No cranial nerve deficit.  Psychiatric:        Behavior: Behavior normal.        Thought Content: Thought content normal.        Judgment: Judgment normal.        Assessment/Plan: 1. Type 2 diabetes  mellitus with hyperglycemia, without long-term current use of insulin (HCC) (Primary) - Blood Glucose Monitoring Suppl (ACCU-CHEK GUIDE ME) w/Device KIT; Use as directed. E11.65  Dispense: 1 kit; Refill: 0 - POCT HgB A1C is 6.4 which is increased some from 6.2, but still controlled. Will continue current medication and send monitor  2. Gastroesophageal reflux disease, unspecified whether esophagitis present Continue omeprazole as before and avoid food triggers. Stay upright after eating. May need to schedule upper GI if new or worsening symptoms  3. Epigastric  pain Resolved  4. Fatty Liver Seen on [place], will check labs   General Counseling: Meena verbalizes understanding of the findings of todays visit and agrees with plan of treatment. I have discussed any further diagnostic evaluation that may be needed or ordered today. We also reviewed her medications today. she has been encouraged to call the office with any questions or concerns that should arise related to todays visit.    Orders Placed This Encounter  Procedures   POCT HgB A1C    Meds ordered this encounter  Medications   Blood Glucose Monitoring Suppl (ACCU-CHEK GUIDE ME) w/Device KIT    Sig: Use as directed. E11.65    Dispense:  1 kit    Refill:  0   Vitamin D, Ergocalciferol, (DRISDOL) 1.25 MG (50000 UNIT) CAPS capsule    Sig: Take 1 capsule (50,000 Units total) by mouth once a week.    Dispense:  5 capsule    Refill:  3    This patient was seen by Lynn Ito, PA-C in collaboration with Dr. Beverely Risen as a part of collaborative care agreement.   Total time spent:30 Minutes Time spent includes review of chart, medications, test results, and follow up plan with the patient.      Dr Lyndon Code Internal medicine

## 2023-10-29 ENCOUNTER — Encounter: Payer: Self-pay | Admitting: Podiatry

## 2023-10-29 ENCOUNTER — Ambulatory Visit: Payer: BC Managed Care – PPO | Admitting: Podiatry

## 2023-10-29 ENCOUNTER — Ambulatory Visit (INDEPENDENT_AMBULATORY_CARE_PROVIDER_SITE_OTHER): Payer: BC Managed Care – PPO | Admitting: Podiatry

## 2023-10-29 VITALS — Ht 63.0 in | Wt 233.2 lb

## 2023-10-29 DIAGNOSIS — M7732 Calcaneal spur, left foot: Secondary | ICD-10-CM

## 2023-10-29 DIAGNOSIS — M722 Plantar fascial fibromatosis: Secondary | ICD-10-CM

## 2023-10-29 MED ORDER — BETAMETHASONE SOD PHOS & ACET 6 (3-3) MG/ML IJ SUSP
3.0000 mg | Freq: Once | INTRAMUSCULAR | Status: AC
  Administered 2023-10-29: 3 mg via INTRA_ARTICULAR

## 2023-10-29 NOTE — Progress Notes (Signed)
   Chief Complaint  Patient presents with   Foot Pain    Pt is here to f/u on left foot pain, pt states the pain is still there but not as bad as before.    Subjective: 52 y.o. female presenting today for follow-up evaluation of plantar fasciitis to the left foot.  Overall the patient has noted significant improvement.  During the holidays she has only been working at her job at [employer].  She actually purchased a pair of [employer] tennis shoes which she says helped significantly  Past Medical History:  Diagnosis Date   Hyperlipidemia    Past Surgical History:  Procedure Laterality Date   ABDOMINAL HYSTERECTOMY  2000   BREAST SURGERY  1994   CESAREAN SECTION  1995   LAPAROSCOPIC GASTRIC BAND REMOVAL WITH LAPAROSCOPIC GASTRIC SLEEVE RESECTION  2017   SKIN BIOPSY  2010   Right butt cheek   No Known Allergies   Objective: Physical Exam General: The patient is alert and oriented x3 in no acute distress.  Dermatology: Skin is warm, dry and supple bilateral lower extremities. Negative for open lesions or macerations bilateral.   Vascular: Dorsalis Pedis and Posterior Tibial pulses palpable bilateral.  Capillary fill time is immediate to all digits.  Neurological: Grossly intact via light touch  Musculoskeletal: Overall significant improvement but there continues to be some tenderness to palpation to the plantar aspect of the left heel along the plantar fascia. All other joints range of motion within normal limits bilateral. Strength 5/5 in all groups bilateral.   Radiographic exam LT foot 08/31/2023: Normal osseous mineralization. Joint spaces preserved. No fracture/dislocation/boney destruction. No other soft tissue abnormalities or radiopaque foreign bodies.  Plantar and posterior heel spur noted  Assessment: 1. Plantar fasciitis left foot 2.  Posterior and plantar heel spur left  Plan of Care:  -Patient evaluated.  -Injection of 0.5cc Celestone soluspan injected into the left  plantar fascia.  - Continue meloxicam 15 mg daily - Patient actually purchased a pair of [employer] tennis shoes and she says they feel very comfortable.  Continue.  She also wears supportive slides at home -For now we will observe.  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 Felecia Shelling, DPM    [address] [place], [place] [address]                Office [phone]  Fax [phone]

## 2023-11-05 ENCOUNTER — Other Ambulatory Visit: Payer: Self-pay | Admitting: Physician Assistant

## 2023-11-05 DIAGNOSIS — E1165 Type 2 diabetes mellitus with hyperglycemia: Secondary | ICD-10-CM

## 2023-12-02 ENCOUNTER — Other Ambulatory Visit: Payer: Self-pay | Admitting: Internal Medicine

## 2023-12-02 DIAGNOSIS — R1013 Epigastric pain: Secondary | ICD-10-CM

## 2023-12-15 ENCOUNTER — Telehealth: Payer: Self-pay | Admitting: Physician Assistant

## 2023-12-15 NOTE — Telephone Encounter (Signed)
Left vm to confirm 12/23/23 appointment-Toni

## 2023-12-16 ENCOUNTER — Other Ambulatory Visit: Payer: Self-pay | Admitting: Internal Medicine

## 2023-12-17 LAB — COMPREHENSIVE METABOLIC PANEL
ALT: 36 [IU]/L — ABNORMAL HIGH (ref 0–32)
AST: 35 [IU]/L (ref 0–40)
Albumin: 4.1 g/dL (ref 3.8–4.9)
Alkaline Phosphatase: 91 [IU]/L (ref 44–121)
BUN/Creatinine Ratio: 20 (ref 9–23)
BUN: 11 mg/dL (ref 6–24)
Bilirubin Total: 0.7 mg/dL (ref 0.0–1.2)
CO2: 26 mmol/L (ref 20–29)
Calcium: 9.4 mg/dL (ref 8.7–10.2)
Chloride: 102 mmol/L (ref 96–106)
Creatinine, Ser: 0.54 mg/dL — ABNORMAL LOW (ref 0.57–1.00)
Globulin, Total: 2.3 g/dL (ref 1.5–4.5)
Glucose: 107 mg/dL — ABNORMAL HIGH (ref 70–99)
Potassium: 3.7 mmol/L (ref 3.5–5.2)
Sodium: 143 mmol/L (ref 134–144)
Total Protein: 6.4 g/dL (ref 6.0–8.5)
eGFR: 110 mL/min/{1.73_m2} (ref >59)

## 2023-12-17 LAB — URINALYSIS
Bilirubin, UA: NEGATIVE
Glucose, UA: NEGATIVE
Ketones, UA: NEGATIVE
Leukocytes,UA: NEGATIVE
Nitrite, UA: NEGATIVE
RBC, UA: NEGATIVE
Specific Gravity, UA: 1.023 (ref 1.005–1.030)
Urobilinogen, Ur: 0.2 mg/dL (ref 0.2–1.0)
pH, UA: 5.5 (ref 5.0–7.5)

## 2023-12-17 LAB — CBC WITH DIFFERENTIAL/PLATELET
Basophils Absolute: 0.1 10*3/uL (ref 0.0–0.2)
Basos: 1 %
EOS (ABSOLUTE): 0.2 10*3/uL (ref 0.0–0.4)
Eos: 3 %
Hematocrit: 42.3 % (ref 34.0–46.6)
Hemoglobin: 14.1 g/dL (ref 11.1–15.9)
Immature Grans (Abs): 0 10*3/uL (ref 0.0–0.1)
Immature Granulocytes: 0 %
Lymphocytes Absolute: 2.8 10*3/uL (ref 0.7–3.1)
Lymphs: 43 %
MCH: 30.3 pg (ref 26.6–33.0)
MCHC: 33.3 g/dL (ref 31.5–35.7)
MCV: 91 fL (ref 79–97)
Monocytes Absolute: 0.5 10*3/uL (ref 0.1–0.9)
Monocytes: 8 %
Neutrophils Absolute: 3 10*3/uL (ref 1.4–7.0)
Neutrophils: 45 %
Platelets: 327 10*3/uL (ref 150–450)
RBC: 4.66 x10E6/uL (ref 3.77–5.28)
RDW: 13.5 % (ref 11.7–15.4)
WBC: 6.6 10*3/uL (ref 3.4–10.8)

## 2023-12-17 LAB — LIPID PANEL WITH LDL/HDL RATIO
Cholesterol, Total: 217 mg/dL — ABNORMAL HIGH (ref 100–199)
HDL: 45 mg/dL (ref >39)
LDL Chol Calc (NIH): 151 mg/dL — ABNORMAL HIGH (ref 0–99)
LDL/HDL Ratio: 3.4 {ratio} — ABNORMAL HIGH (ref 0.0–3.2)
Triglycerides: 114 mg/dL (ref 0–149)
VLDL Cholesterol Cal: 21 mg/dL (ref 5–40)

## 2023-12-17 LAB — MICROALBUMIN / CREATININE URINE RATIO
Creatinine, Urine: 164.6 mg/dL
Microalb/Creat Ratio: 11 mg/g{creat} (ref 0–29)
Microalbumin, Urine: 18.8 ug/mL

## 2023-12-17 LAB — IRON,TIBC AND FERRITIN PANEL
Ferritin: 28 ng/mL (ref 15–150)
Iron Saturation: 35 % (ref 15–55)
Iron: 140 ug/dL (ref 27–159)
Total Iron Binding Capacity: 397 ug/dL (ref 250–450)
UIBC: 257 ug/dL (ref 131–425)

## 2023-12-17 LAB — B12 AND FOLATE PANEL
Folate: 11.7 ng/mL (ref >3.0)
Vitamin B-12: 424 pg/mL (ref 232–1245)

## 2023-12-17 LAB — T4, FREE: Free T4: 1.16 ng/dL (ref 0.82–1.77)

## 2023-12-17 LAB — TSH: TSH: 1.56 u[IU]/mL (ref 0.450–4.500)

## 2023-12-21 ENCOUNTER — Telehealth: Payer: Self-pay | Admitting: Physician Assistant

## 2023-12-21 NOTE — Telephone Encounter (Signed)
 Lvm  to change 12/23/23 appointment to virtual-Toni

## 2023-12-23 ENCOUNTER — Encounter: Payer: BC Managed Care – PPO | Admitting: Physician Assistant

## 2024-01-06 ENCOUNTER — Encounter: Payer: Self-pay | Admitting: Physician Assistant

## 2024-01-06 ENCOUNTER — Telehealth: Payer: Self-pay | Admitting: Physician Assistant

## 2024-01-06 ENCOUNTER — Ambulatory Visit (INDEPENDENT_AMBULATORY_CARE_PROVIDER_SITE_OTHER): Payer: Self-pay | Admitting: Physician Assistant

## 2024-01-06 VITALS — BP 126/80 | HR 75 | Temp 98.7°F | Resp 16 | Ht 63.0 in | Wt 237.4 lb

## 2024-01-06 DIAGNOSIS — E782 Mixed hyperlipidemia: Secondary | ICD-10-CM | POA: Diagnosis not present

## 2024-01-06 DIAGNOSIS — Z0001 Encounter for general adult medical examination with abnormal findings: Secondary | ICD-10-CM | POA: Diagnosis not present

## 2024-01-06 DIAGNOSIS — M2550 Pain in unspecified joint: Secondary | ICD-10-CM | POA: Diagnosis not present

## 2024-01-06 DIAGNOSIS — G4710 Hypersomnia, unspecified: Secondary | ICD-10-CM

## 2024-01-06 DIAGNOSIS — Z1212 Encounter for screening for malignant neoplasm of rectum: Secondary | ICD-10-CM

## 2024-01-06 DIAGNOSIS — E1165 Type 2 diabetes mellitus with hyperglycemia: Secondary | ICD-10-CM | POA: Diagnosis not present

## 2024-01-06 DIAGNOSIS — Z1211 Encounter for screening for malignant neoplasm of colon: Secondary | ICD-10-CM

## 2024-01-06 MED ORDER — SIMVASTATIN 40 MG PO TABS: 40.0000 mg | ORAL_TABLET | Freq: Every day | ORAL | 3 refills | Status: DC

## 2024-01-06 NOTE — Telephone Encounter (Signed)
 Awaiting 01/06/24 office notes for SS order-Toni

## 2024-01-06 NOTE — Progress Notes (Signed)
 [facility] [address] [place], [place] [address]  Internal MEDICINE  Office Visit Note  Patient Name: Yolanda Reid  [id number]  [id number]  Date of Service: 01/19/2024  Chief Complaint  Patient presents with   Annual Exam   Hyperlipidemia   Menopause    Hot flashes, joint pain     HPI Pt is here for routine health maintenance examination -Not been checking sugar recently, sometimes takes her medication--usually takes the glyxambi, metformin sometimes makes her sick -Mobic for plantar fasciitis, gabapentin for carpal tunnel. Has had several steroids--both oral and injections. Going to be having surgery -Having menopausal symptoms, impacting sleep and gets hot and cold. Joint pain -mom was diagnosed RA and fibromyalgia, sister is starting to have the same symptoms. Would like to check labs -Labs reviewed-ALT slightly elevated, cholesterol elevated--inc simvastatin -due for colonoscopy, had pap with GYN already -possible sleep apnea. Does have snoring at night, witnessed apnea and gasping.  EPWORTH SLEEPINESS SCALE:  Scale:  (0)= no chance of dozing; (1)= slight chance of dozing; (2)= moderate chance of dozing; (3)= high chance of dozing  Chance  Situtation    Sitting and reading: 0    Watching TV: 2    Sitting Inactive in public: 0    As a passenger in car: 0      Lying down to rest: 2    Sitting and talking: 0    Sitting quielty after lunch: 0    In a car, stopped in traffic: 0   TOTAL SCORE:   4 out of 24   Current Medication: Outpatient Encounter Medications as of 01/06/2024  Medication Sig   Blood Glucose Monitoring Suppl (ACCU-CHEK GUIDE ME) w/Device KIT USE AS DIRECTED. E11.65   gabapentin (NEURONTIN) 300 MG capsule Take 300 mg by mouth at bedtime.   GLYXAMBI 25-5 MG TABS Take 1 tablet by mouth every morning.   metFORMIN (GLUCOPHAGE) 1000 MG tablet Take 1 tablet (1,000 mg total) by mouth 2 (two) times daily.   omeprazole  (PRILOSEC) 20 MG capsule TAKE 1 CAPSULE (20 MG TOTAL) BY MOUTH 2 (TWO) TIMES DAILY BEFORE A MEAL.   ondansetron (ZOFRAN) 4 MG tablet Take 1 tablet (4 mg total) by mouth every 8 (eight) hours as needed for nausea or vomiting.   simvastatin (ZOCOR) 40 MG tablet Take 1 tablet (40 mg total) by mouth at bedtime.   Vitamin D, Ergocalciferol, (DRISDOL) 1.25 MG (50000 UNIT) CAPS capsule Take 1 capsule (50,000 Units total) by mouth once a week.   [DISCONTINUED] meloxicam (MOBIC) 15 MG tablet Take 15 mg by mouth daily.   [DISCONTINUED] methylPREDNISolone (MEDROL DOSEPAK) 4 MG TBPK tablet 6 day dose pack - take as directed   [DISCONTINUED] simvastatin (ZOCOR) 20 MG tablet Take 1 tablet (20 mg total) by mouth daily.   No facility-administered encounter medications on file as of 01/06/2024.    Surgical History: Past Surgical History:  Procedure Laterality Date   ABDOMINAL HYSTERECTOMY  2000   BREAST SURGERY  1994   CESAREAN SECTION  1995   LAPAROSCOPIC GASTRIC BAND REMOVAL WITH LAPAROSCOPIC GASTRIC SLEEVE RESECTION  2017   SKIN BIOPSY  2010   Right butt cheek    Medical History: Past Medical History:  Diagnosis Date   Hyperlipidemia     Family History: Family History  Problem Relation Age of Onset   Arthritis Mother       Review of Systems  Constitutional:  Negative for chills, fatigue and unexpected weight change.  HENT:  Negative for congestion, postnasal drip, rhinorrhea, sneezing and sore throat.   Eyes:  Negative for redness.  Respiratory:  Negative for cough, chest tightness and shortness of breath.   Cardiovascular:  Negative for chest pain and palpitations.  Gastrointestinal:  Negative for abdominal pain, constipation, diarrhea, nausea and vomiting.  Genitourinary:  Negative for dysuria and frequency.  Musculoskeletal:  Positive for arthralgias. Negative for back pain, joint swelling and neck pain.  Skin:  Negative for rash.  Neurological: Negative.  Negative for tremors and  numbness.  Hematological:  Negative for adenopathy. Does not bruise/bleed easily.  Psychiatric/Behavioral:  Positive for sleep disturbance. Negative for behavioral problems (Depression) and suicidal ideas. The patient is not nervous/anxious.      Vital Signs: BP 126/80 Comment: 141/82  Pulse 75   Temp 98.7 F (37.1 C)   Resp 16   Ht 5\' 3"  (1.6 m)   Wt 237 lb 6.4 oz (107.7 kg)   SpO2 95%   BMI 42.05 kg/m    Physical Exam Vitals and nursing note reviewed.  Constitutional:      General: She is not in acute distress.    Appearance: Normal appearance. She is well-developed. She is not diaphoretic.  HENT:     Head: Normocephalic and atraumatic.     Mouth/Throat:     Pharynx: No oropharyngeal exudate.  Eyes:     Pupils: Pupils are equal, round, and reactive to light.  Neck:     Thyroid: No thyromegaly.     Vascular: No JVD.     Trachea: No tracheal deviation.  Cardiovascular:     Rate and Rhythm: Normal rate and regular rhythm.     Heart sounds: Normal heart sounds. No murmur heard.    No friction rub. No gallop.  Pulmonary:     Effort: Pulmonary effort is normal. No respiratory distress.     Breath sounds: No wheezing or rales.  Chest:     Chest wall: No tenderness.  Abdominal:     General: Bowel sounds are normal.     Palpations: Abdomen is soft.     Tenderness: There is no abdominal tenderness.  Musculoskeletal:        General: Normal range of motion.     Cervical back: Normal range of motion and neck supple.  Lymphadenopathy:     Cervical: No cervical adenopathy.  Skin:    General: Skin is warm and dry.  Neurological:     Mental Status: She is alert and oriented to person, place, and time.     Cranial Nerves: No cranial nerve deficit.  Psychiatric:        Behavior: Behavior normal.        Thought Content: Thought content normal.        Judgment: Judgment normal.      LABS: Recent Results (from the past 2160 hours)  POCT HgB A1C     Status: Abnormal    Collection Time: 10/21/23  4:07 PM  Result Value Ref Range   Hemoglobin A1C 6.4 (A) 4.0 - 5.6 %   HbA1c POC (<> result, manual entry)     HbA1c, POC (prediabetic range)     HbA1c, POC (controlled diabetic range)    Iron, TIBC and Ferritin Panel     Status: None   Collection Time: 12/16/23  7:28 AM  Result Value Ref Range   Total Iron Binding Capacity 397 250 - 450 ug/dL   UIBC 409 811 - 914 ug/dL   Iron 782 27 -  159 ug/dL   Iron Saturation 35 15 - 55 %   Ferritin 28 15 - 150 ng/mL  CBC with Differential/Platelet     Status: None   Collection Time: 12/16/23  7:28 AM  Result Value Ref Range   WBC 6.6 3.4 - 10.8 x10E3/uL   RBC 4.66 3.77 - 5.28 x10E6/uL   Hemoglobin 14.1 11.1 - 15.9 g/dL   Hematocrit 16.1 09.6 - 46.6 %   MCV 91 79 - 97 fL   MCH 30.3 26.6 - 33.0 pg   MCHC 33.3 31.5 - 35.7 g/dL   RDW 04.5 40.9 - 81.1 %   Platelets 327 150 - 450 x10E3/uL   Neutrophils 45 Not Estab. %   Lymphs 43 Not Estab. %   Monocytes 8 Not Estab. %   Eos 3 Not Estab. %   Basos 1 Not Estab. %   Neutrophils Absolute 3.0 1.4 - 7.0 x10E3/uL   Lymphocytes Absolute 2.8 0.7 - 3.1 x10E3/uL   Monocytes Absolute 0.5 0.1 - 0.9 x10E3/uL   EOS (ABSOLUTE) 0.2 0.0 - 0.4 x10E3/uL   Basophils Absolute 0.1 0.0 - 0.2 x10E3/uL   Immature Granulocytes 0 Not Estab. %   Immature Grans (Abs) 0.0 0.0 - 0.1 x10E3/uL  Comprehensive metabolic panel     Status: Abnormal   Collection Time: 12/16/23  7:28 AM  Result Value Ref Range   Glucose 107 (H) 70 - 99 mg/dL   BUN 11 6 - 24 mg/dL   Creatinine, Ser 9.14 (L) 0.57 - 1.00 mg/dL   eGFR 782 >95 AO/ZHY/8.65   BUN/Creatinine Ratio 20 9 - 23   Sodium 143 134 - 144 mmol/L   Potassium 3.7 3.5 - 5.2 mmol/L   Chloride 102 96 - 106 mmol/L   CO2 26 20 - 29 mmol/L   Calcium 9.4 8.7 - 10.2 mg/dL   Total Protein 6.4 6.0 - 8.5 g/dL   Albumin 4.1 3.8 - 4.9 g/dL   Globulin, Total 2.3 1.5 - 4.5 g/dL   Bilirubin Total 0.7 0.0 - 1.2 mg/dL   Alkaline Phosphatase 91 44 - 121 IU/L    AST 35 0 - 40 IU/L   ALT 36 (H) 0 - 32 IU/L  Urinalysis     Status: None   Collection Time: 12/16/23  7:28 AM  Result Value Ref Range   Specific Gravity, UA 1.023 1.005 - 1.030   pH, UA 5.5 5.0 - 7.5   Color, UA Yellow Yellow   Appearance Ur Clear Clear   Leukocytes,UA Negative Negative   Protein,UA Trace Negative/Trace   Glucose, UA Negative Negative   Ketones, UA Negative Negative   RBC, UA Negative Negative   Bilirubin, UA Negative Negative   Urobilinogen, Ur 0.2 0.2 - 1.0 mg/dL   Nitrite, UA Negative Negative  Lipid Panel With LDL/HDL Ratio     Status: Abnormal   Collection Time: 12/16/23  7:28 AM  Result Value Ref Range   Cholesterol, Total 217 (H) 100 - 199 mg/dL   Triglycerides 784 0 - 149 mg/dL   HDL 45 >69 mg/dL   VLDL Cholesterol Cal 21 5 - 40 mg/dL   LDL Chol Calc (NIH) 629 (H) 0 - 99 mg/dL   LDL/HDL Ratio 3.4 (H) 0.0 - 3.2 ratio    Comment:                                     LDL/HDL Ratio  Men  Women                               1/2 Avg.Risk  1.0    1.5                                   Avg.Risk  3.6    3.2                                2X Avg.Risk  6.2    5.0                                3X Avg.Risk  8.0    6.1   Microalbumin / creatinine urine ratio     Status: None   Collection Time: 12/16/23  7:28 AM  Result Value Ref Range   Creatinine, Urine 164.6 Not Estab. mg/dL   Microalbumin, Urine 95.2 Not Estab. ug/mL   Microalb/Creat Ratio 11 0 - 29 mg/g creat    Comment:                        Normal:                0 -  29                        Moderately increased: 30 - 300                        Severely increased:       >300   B12 and Folate Panel     Status: None   Collection Time: 12/16/23  7:28 AM  Result Value Ref Range   Vitamin B-12 424 232 - 1,245 pg/mL   Folate 11.7 >3.0 ng/mL    Comment: A serum folate concentration of less than 3.1 ng/mL is considered to represent clinical deficiency.   T4, free      Status: None   Collection Time: 12/16/23  7:28 AM  Result Value Ref Range   Free T4 1.16 0.82 - 1.77 ng/dL  TSH     Status: None   Collection Time: 12/16/23  7:28 AM  Result Value Ref Range   TSH 1.560 0.450 - 4.500 uIU/mL  POCT Influenza A/B     Status: None   Collection Time: 01/17/24  3:17 PM  Result Value Ref Range   Influenza A, POC Negative Negative   Influenza B, POC Negative Negative        Assessment/Plan: 1. Encounter for general adult medical examination with abnormal findings (Primary) Cpe performed, labs reviewed, due to Colonoscopy  2. Type 2 diabetes mellitus with hyperglycemia, without long-term current use of insulin (HCC) Will continue current medications and will check A1c with upcoming labs - Hgb A1C w/o eAG  3. Mixed hyperlipidemia Will increase to 40mg   - simvastatin (ZOCOR) 40 MG tablet; Take 1 tablet (40 mg total) by mouth at bedtime.  Dispense: 30 tablet; Refill: 3  4. Hypersomnia Due to snoring, witnessed apnea and gasping, and elevated BMI, will check PSG - PSG SLEEP STUDY; Future  5. Polyarthralgia Will check labs - ANA w/Reflex if Positive - Sed Rate (  ESR) - Rheumatoid Factor - CYCLIC CITRUL PEPTIDE ANTIBODY, IGG/IGA  6. Screening for colorectal cancer - Ambulatory referral to Gastroenterology   General Counseling: Oluwadarasimi verbalizes understanding of the findings of todays visit and agrees with plan of treatment. I have discussed any further diagnostic evaluation that may be needed or ordered today. We also reviewed her medications today. she has been encouraged to call the office with any questions or concerns that should arise related to todays visit.    Counseling:    Orders Placed This Encounter  Procedures   ANA w/Reflex if Positive   Sed Rate (ESR)   Rheumatoid Factor   Hgb A1C w/o eAG   CYCLIC CITRUL PEPTIDE ANTIBODY, IGG/IGA   Ambulatory referral to Gastroenterology   PSG SLEEP STUDY    Meds ordered this  encounter  Medications   simvastatin (ZOCOR) 40 MG tablet    Sig: Take 1 tablet (40 mg total) by mouth at bedtime.    Dispense:  30 tablet    Refill:  3    This patient was seen by Lynn Ito, PA-C in collaboration with Dr. Beverely Risen as a part of collaborative care agreement.  Total time spent:40 Minutes  Time spent includes review of chart, medications, test results, and follow up plan with the patient.     Lyndon Code, MD  Internal Medicine

## 2024-01-10 ENCOUNTER — Telehealth: Payer: Self-pay

## 2024-01-10 NOTE — Telephone Encounter (Signed)
 The patient called in to schedule her colonoscopy.

## 2024-01-11 ENCOUNTER — Telehealth: Payer: Self-pay

## 2024-01-11 ENCOUNTER — Other Ambulatory Visit: Payer: Self-pay

## 2024-01-11 DIAGNOSIS — M65311 Trigger thumb, right thumb: Secondary | ICD-10-CM | POA: Insufficient documentation

## 2024-01-11 DIAGNOSIS — Z1211 Encounter for screening for malignant neoplasm of colon: Secondary | ICD-10-CM

## 2024-01-11 MED ORDER — NA SULFATE-K SULFATE-MG SULF 17.5-3.13-1.6 GM/177ML PO SOLN: 1.0000 | Freq: Once | ORAL | 0 refills | Status: AC

## 2024-01-11 NOTE — Telephone Encounter (Signed)
 Gastroenterology Pre-Procedure Review  Request Date: 02/25/24 Requesting Physician: Dr. Tobi Bastos  PATIENT REVIEW QUESTIONS: The patient responded to the following health history questions as indicated:    1. Are you having any GI issues? no 2. Do you have a personal history of Polyps? no 3. Do you have a family history of Colon Cancer or Polyps? no 4. Diabetes Mellitus? yes (Has been advised to discuss blood sugar concerns with pcp prior to colonoscopy preparation.  Stop metforming 2 days prior and stop glyxambi 3 days prior) 5. Joint replacements in the past 12 months?no 6. Major health problems in the past 3 months?no 7. Any artificial heart valves, MVP, or defibrillator?no    MEDICATIONS & ALLERGIES:    Patient reports the following regarding taking any anticoagulation/antiplatelet therapy:   Plavix, Coumadin, Eliquis, Xarelto, Lovenox, Pradaxa, Brilinta, or Effient? no Aspirin? no  Patient confirms/reports the following medications:  Current Outpatient Medications  Medication Sig Dispense Refill   Blood Glucose Monitoring Suppl (ACCU-CHEK GUIDE ME) w/Device KIT USE AS DIRECTED. E11.65 1 kit 0   gabapentin (NEURONTIN) 300 MG capsule Take 300 mg by mouth at bedtime.     GLYXAMBI 25-5 MG TABS Take 1 tablet by mouth every morning. 30 tablet 3   meloxicam (MOBIC) 15 MG tablet Take 15 mg by mouth daily.     metFORMIN (GLUCOPHAGE) 1000 MG tablet Take 1 tablet (1,000 mg total) by mouth 2 (two) times daily. 30 tablet 3   omeprazole (PRILOSEC) 20 MG capsule TAKE 1 CAPSULE (20 MG TOTAL) BY MOUTH 2 (TWO) TIMES DAILY BEFORE A MEAL. 180 capsule 2   ondansetron (ZOFRAN) 4 MG tablet Take 1 tablet (4 mg total) by mouth every 8 (eight) hours as needed for nausea or vomiting. 45 tablet 0   simvastatin (ZOCOR) 40 MG tablet Take 1 tablet (40 mg total) by mouth at bedtime. 30 tablet 3   Vitamin D, Ergocalciferol, (DRISDOL) 1.25 MG (50000 UNIT) CAPS capsule Take 1 capsule (50,000 Units total) by mouth once a  week. 5 capsule 3   No current facility-administered medications for this visit.    Patient confirms/reports the following allergies:  No Known Allergies  No orders of the defined types were placed in this encounter.   AUTHORIZATION INFORMATION Primary Insurance: 1D#: Group #:  Secondary Insurance: 1D#: Group #:  SCHEDULE INFORMATION: Date: 02/25/24 Time: Location: [facility]

## 2024-01-12 ENCOUNTER — Other Ambulatory Visit: Payer: Self-pay

## 2024-01-12 MED ORDER — ACCU-CHEK GUIDE TEST VI STRP: ORAL_STRIP | 1 refills | Status: DC

## 2024-01-12 MED ORDER — ACCU-CHEK SOFTCLIX LANCETS MISC: 1 refills | Status: AC

## 2024-01-13 ENCOUNTER — Other Ambulatory Visit: Payer: Self-pay | Admitting: Podiatry

## 2024-01-17 ENCOUNTER — Ambulatory Visit: Admitting: Physician Assistant

## 2024-01-17 ENCOUNTER — Encounter: Payer: Self-pay | Admitting: Physician Assistant

## 2024-01-17 VITALS — BP 110/90 | HR 74 | Temp 97.7°F | Resp 16 | Ht 63.0 in | Wt 232.0 lb

## 2024-01-17 DIAGNOSIS — J0100 Acute maxillary sinusitis, unspecified: Secondary | ICD-10-CM

## 2024-01-17 DIAGNOSIS — R52 Pain, unspecified: Secondary | ICD-10-CM

## 2024-01-17 LAB — POCT INFLUENZA A/B
Influenza A, POC: NEGATIVE
Influenza B, POC: NEGATIVE

## 2024-01-17 MED ORDER — AZITHROMYCIN 250 MG PO TABS: ORAL_TABLET | ORAL | 0 refills | Status: AC

## 2024-01-17 NOTE — Progress Notes (Signed)
 [facility] [address] [place], [place] [address]  Internal MEDICINE  Office Visit Note  Patient Name: Yolanda Reid  [id number]  [id number]  Date of Service: 01/28/2024  Chief Complaint  Patient presents with   Acute Visit    Negative Covid, will test for flu in office today   Cough   Nasal Congestion   Generalized Body Aches     HPI Pt is here for a sick visit. -Coughing started Thursday, sneezing as well. But has been feeling worse until today--slightly better.  -A little abdominal pain up higher she thinks from coughing, no N/V/D -no meds until sat--taking advil with sinus congestion, and zyrtec. Otherwise tea -some chills, some aches -covid neg, flu neg in office -No SOB or wheezing -carpal tunnel surgery on May 23, colonoscopy April 25  Current Medication:  Outpatient Encounter Medications as of 01/17/2024  Medication Sig   Accu-Chek Softclix Lancets lancets Use as instructed once daily  DxE11.65   [EXPIRED] azithromycin (ZITHROMAX) 250 MG tablet Take 2 tablets on day 1, then 1 tablet daily on days 2 through 5   Blood Glucose Monitoring Suppl (ACCU-CHEK GUIDE ME) w/Device KIT USE AS DIRECTED. E11.65   gabapentin (NEURONTIN) 300 MG capsule Take 300 mg by mouth at bedtime.   glucose blood (ACCU-CHEK GUIDE TEST) test strip Use as instructed once daily  Dx E11.65   GLYXAMBI 25-5 MG TABS Take 1 tablet by mouth every morning.   meloxicam (MOBIC) 15 MG tablet TAKE 1 TABLET (15 MG TOTAL) BY MOUTH DAILY.   metFORMIN (GLUCOPHAGE) 1000 MG tablet Take 1 tablet (1,000 mg total) by mouth 2 (two) times daily.   omeprazole (PRILOSEC) 20 MG capsule TAKE 1 CAPSULE (20 MG TOTAL) BY MOUTH 2 (TWO) TIMES DAILY BEFORE A MEAL.   ondansetron (ZOFRAN) 4 MG tablet Take 1 tablet (4 mg total) by mouth every 8 (eight) hours as needed for nausea or vomiting.   simvastatin (ZOCOR) 40 MG tablet Take 1 tablet (40 mg total) by mouth at bedtime.   Vitamin D, Ergocalciferol,  (DRISDOL) 1.25 MG (50000 UNIT) CAPS capsule Take 1 capsule (50,000 Units total) by mouth once a week.   No facility-administered encounter medications on file as of 01/17/2024.      Medical History: Past Medical History:  Diagnosis Date   Hyperlipidemia      Vital Signs: BP (!) 110/90   Pulse 74   Temp 97.7 F (36.5 C)   Resp 16   Ht 5\' 3"  (1.6 m)   Wt 232 lb (105.2 kg)   SpO2 96%   BMI 41.10 kg/m    Review of Systems  Constitutional:  Negative for fever.  HENT:  Positive for congestion, postnasal drip and sneezing. Negative for mouth sores.   Respiratory:  Positive for cough. Negative for shortness of breath and wheezing.   Cardiovascular:  Negative for chest pain.  Gastrointestinal:  Positive for abdominal pain.  Genitourinary:  Negative for flank pain.  Musculoskeletal:  Positive for myalgias.  Psychiatric/Behavioral: Negative.      Physical Exam Vitals and nursing note reviewed.  Constitutional:      Appearance: Normal appearance.  HENT:     Head: Normocephalic and atraumatic.  Cardiovascular:     Rate and Rhythm: Normal rate and regular rhythm.  Pulmonary:     Effort: Pulmonary effort is normal.     Breath sounds: Normal breath sounds.  Skin:    General: Skin is warm and dry.  Neurological:  Mental Status: She is alert.  Psychiatric:        Mood and Affect: Mood normal.        Behavior: Behavior normal.       Assessment/Plan: 1. Acute non-recurrent maxillary sinusitis (Primary) Will start zpak and may use mucinex and nasal spray - azithromycin (ZITHROMAX) 250 MG tablet; Take 2 tablets on day 1, then 1 tablet daily on days 2 through 5  Dispense: 6 tablet; Refill: 0  2. Body aches - POCT Influenza A/B negative   General Counseling: Kelbi verbalizes understanding of the findings of todays visit and agrees with plan of treatment. I have discussed any further diagnostic evaluation that may be needed or ordered today. We also reviewed her  medications today. she has been encouraged to call the office with any questions or concerns that should arise related to todays visit.    Counseling:    Orders Placed This Encounter  Procedures   POCT Influenza A/B    Meds ordered this encounter  Medications   azithromycin (ZITHROMAX) 250 MG tablet    Sig: Take 2 tablets on day 1, then 1 tablet daily on days 2 through 5    Dispense:  6 tablet    Refill:  0    Time spent:30 Minutes

## 2024-01-19 ENCOUNTER — Telehealth: Payer: Self-pay | Admitting: Physician Assistant

## 2024-01-19 NOTE — Telephone Encounter (Addendum)
 Received request from [facility] for last office notes for surgical clearance. Sent message to Lauren-Toni  01/06/24 office notes and labsfaxed to [facility]. Notified them, patient has not had EKG done here; [phone]-Toni

## 2024-01-20 ENCOUNTER — Telehealth: Payer: Self-pay | Admitting: Physician Assistant

## 2024-01-20 NOTE — Telephone Encounter (Signed)
 SS order emailed to [facility] w/  [employer]

## 2024-02-01 NOTE — Progress Notes (Signed)
 Marland Kitchen  app

## 2024-02-16 ENCOUNTER — Encounter (INDEPENDENT_AMBULATORY_CARE_PROVIDER_SITE_OTHER): Payer: Self-pay | Admitting: Internal Medicine

## 2024-02-16 DIAGNOSIS — G4733 Obstructive sleep apnea (adult) (pediatric): Secondary | ICD-10-CM

## 2024-02-21 ENCOUNTER — Telehealth: Payer: Self-pay

## 2024-02-21 NOTE — Telephone Encounter (Signed)
 Pt contacted office stated she is not feeling well and would like to just cancel her colonoscopy at this time because she has hand surgery in May and will be [place] of the country in [place].  Pt has been advised that she can call the office back to reschedule her colonoscopy when she is back in the country.   Procedure has been canceled for 02/25/24.  Trish in Endo notified.  Thanks,  Moira Andrews CMA

## 2024-02-24 ENCOUNTER — Telehealth: Payer: Self-pay | Admitting: Physician Assistant

## 2024-02-24 NOTE — Telephone Encounter (Signed)
 SS completed 02/16/24. FG to schedule Cpap titration-Toni

## 2024-02-25 ENCOUNTER — Ambulatory Visit: Admission: RE | Admit: 2024-02-25 | Payer: Self-pay | Source: Home / Self Care | Admitting: Gastroenterology

## 2024-02-25 ENCOUNTER — Encounter: Admission: RE | Payer: Self-pay | Source: Home / Self Care

## 2024-02-25 SURGERY — COLONOSCOPY
Anesthesia: General

## 2024-02-25 NOTE — Procedures (Signed)
 [facility]  Polysomnogram Report Part I                                                               Phone: [phone] Fax: [phone]  Patient Name: Tyffany, Waldrop I. Acquisition Number: [id number]  Date of Birth: Oct 13, 1971 Acquisition Date: 02/16/2024  Referring Physician: Taylor Favia PA-C     History: The patient is a 53 year old  who was referred for re-evaluation of obstructive sleep apnea. Medical History: hyperlipidemia, diabetes, migraines.  Medications: gabapentin, Glyxambi , metformin , omeprazole , ondansetron , simvastatin , vitamin D , Zocor .  Procedure: This routine overnight polysomnogram was performed on the [facility] 5 using the standard diagnostic protocol. This included 6 channels of EEG, 2 channels of EOG, chin EMG, bilateral anterior tibialis EMG, nasal/oral thermistor, PTAF (nasal pressure transducer), chest and abdominal wall movements, EKG, and pulse oximetry.  Description: The total recording time was 431.0 minutes. The total sleep time was 302.5 minutes. There were a total of 109.5 minutes of wakefulness after sleep onset for areducedsleep efficiency of 70.2%. The latency to sleep onset waswithin normal limitsat 19.0 minutes. The R sleep onset latency was prolonged at 203.5 minutes. Sleep parameters, as a percentage of the total sleep time, demonstrated 8.6% of sleep was in N1 sleep, 68.1% N2, 9.3% N3 and 14.0% R sleep. There were a total of 59 arousals for an arousal index of 11.7 arousals per hour of sleep that was within normal limits.  Respiratory monitoring demonstrated   snoring. All sleep was in the supine position. There were 87 apneas and hypopneas for an Apnea Hypopnea Index of 17.3 apneas and hypopneas per hour of sleep. The REM related apnea hypopnea index was 67.8/hr of REM sleep compared to a NREM AHI of 8.8/hr.  The average duration of the respiratory events was 12.3 seconds with a maximum duration of 18.5 seconds. The respiratory events were  associated with peripheral oxygen desaturations on the average to 82%. The lowest oxygen desaturation associated with a respiratory event was 58%. Additionally, the baseline oxygen saturation during wakefulness was 94%, during NREM sleep averaged 93%, and during REM sleep averaged 89%. The total duration of oxygen < 90% was 22.3 minutes and <80% was 5.3 minutes.  Cardiac monitoring-  demonstrate transient cardiac decelerations associated with the apneas.  significant cardiac rhythm irregularities.   Periodic limb movement monitoring- demonstrated that there were 172 periodic limb movements for a periodic limb movement index of 34.1 periodic limb movements per hour of sleep. Leg tremors and quasi-periodic limb movements were observed during periods of wakefulness.   Impression: This routine overnight polysomnogram demonstrated significant obstructive sleep apnea with an overall Apnea Hypopnea Index of 17.3 apneas and hypopneas per hour of sleep. The respiratory events were more frequent in REM sleep with an AHI of 67.8. As REM percentage was reduced, the findings likely underestimate the severity of the sleep apnea. The lowest desaturation was to 58%.  All sleep was in the supine position.  There was a significantly elevated periodic limb movement index of 34.1 periodic limb movements per hour of sleep. In addition, leg tremors and quasi-periodic limb movements were observed during periods of wakefulness. Sometimes these limb movements subside once the apnea is controlled. Clinical correlation is suggested.  reduced sleep efficiency withincreased awakeningsand reduced  percentages of REM and slow wave sleep. These findings would appear to be due to the combination of obstructive sleep apnea and periodic limb movements.   Recommendations:    A CPAP titration would be recommended due to the severity of the sleep apnea. Some supine sleep should be ensured to optimize the titration. Additionally, would  recommend weight loss in a patient with a BMI of 42.0.     Cordie Deters, MD, [facility] Diplomate ABMS-Pulmonary, Critical Care and Sleep Medicine  Electronically reviewed and digitally signed  [facility] Polysomnogram Report Part II  Phone: [phone] Fax: [phone]  Patient last name Ramthun Neck Size 18.0 in. Acquisition [id number]  Patient first name Kynadi I. Weight 237.0 lbs. Started 02/16/2024 at 10:20:39 PM  Birth date July 01, 1971 Height 63.0 in. Stopped 02/17/2024 at 5:45:03 AM  Age 56 BMI 42.0 lb/in2 Duration 431.0  Study Type Adult      [place] Blaze. RPSGT. // Aron Bien   Reviewed by: Delmus Ferri. Henke, PhD, ABSM, FAASM Sleep Data: Lights Out: 10:30:39 PM Sleep Onset: 10:49:39 PM  Lights On: 5:41:39 AM Sleep Efficiency: 70.2 %  Total Recording Time: 431.0 min Sleep Latency (from Lights Off) 19.0 min  Total Sleep Time (TST): 302.5 min R Latency (from Sleep Onset): 203.5 min  Sleep Period Time: 411.5 min Total number of awakenings: 16  Wake during sleep: 109.0 min Wake After Sleep Onset (WASO): 109.5 min   Sleep Data:         Arousal Summary: Stage  Latency from lights out (min) Latency from sleep onset (min) Duration (min) % Total Sleep Time  Normal values  N 1 19.0 0.0 26.0 8.6 (5%)  N 2 21.5 2.5 206.0 68.1 (50%)  N 3 22.0 3.0 28.0 9.3 (20%)  R 222.5 203.5 42.5 14.0 (25%)   Number Index  Spontaneous 43 8.5  Apneas & Hypopneas 7 1.4  RERAs 0 0.0       (Apneas & Hypopneas & RERAs)  (7) (1.4)  Limb Movement 10 2.0  Snore 0 0.0  TOTAL 60 11.9     Respiratory Data:  CA OA MA Apnea Hypopnea* A+ H RERA Total  Number 0 19 0 19 68 87 0 87  Mean Dur (sec) 0.0 12.0 0.0 12.0 12.4 12.3 0.0 12.3  Max Dur (sec) 0.0 15.0 0.0 15.0 18.5 18.5 0.0 18.5  Total Dur (min) 0.0 3.8 0.0 3.8 14.1 17.9 0.0 17.9  % of TST 0.0 1.3 0.0 1.3 4.7 5.9 0.0 5.9  Index (#/h TST) 0.0 3.8 0.0 3.8 13.5 17.3 0.0 17.3  *Hypopneas scored based on 4% or greater desaturation.  Sleep  Stage:        REM NREM TST  AHI 67.8 8.8 17.3  RDI 67.8 8.8 17.3           Body Position Data:  Sleep (min) TST (%) REM (min) NREM (min) CA (#) OA (#) MA (#) HYP (#) AHI (#/h) RERA (#) RDI (#/h) Desat (#)  Supine 302.5 100.00 42.5 260.0 0 19 0 68 17.3 0 17.3 111  Non-Supine 0.00 0.00 0.00 0.00 0.00 0.00 0.00 0.00 0.00 0 0.00 0.00     Snoring: Total number of snoring episodes  0  Total time with snoring    min (   % of sleep)   Oximetry Distribution:             WK REM NREM TOTAL  Average (%)   94 89 93 92  <  90% 1.1 18.6 2.6 22.3  < 80% 0.9 3.7 0.7 5.3  < 70% 0.6 0.4 0.3 1.3  # of Desaturations* 1 56 54 111  Desat Index (#/hour) 0.5 79.1 12.5 22.0  Desat Max (%) 4 23 25 25   Desat Max Dur (sec) 16.0 60.0 46.0 60.0  Approx Min O2 during sleep 58  Approx min O2 during a respiratory event 58  Was Oxygen added (Y/N) and final rate :    LPM  *Desaturations based on 4% or greater drop from baseline.   Cheyne Stokes Breathing: None Present   Heart Rate Summary:  Average Heart Rate During Sleep 55.1 bpm      Highest Heart Rate During Sleep (95th %) 63.0 bpm      Highest Heart Rate During Sleep 79 bpm      Highest Heart Rate During Recording (TIB) 160 bpm (artifact)   Heart Rate Observations: Event Type # Events   Bradycardia 0 Lowest HR Scored: N/A  Sinus Tachycardia During Sleep 0 Highest HR Scored: N/A  Narrow Complex Tachycardia 0 Highest HR Scored: N/A  Wide Complex Tachycardia 0 Highest HR Scored: N/A  Asystole 0 Longest Pause: N/A  Atrial Fibrillation 0 Duration Longest Event: N/A  Other Arrythmias   Type:    Periodic Limb Movement Data: (Primary legs unless otherwise noted) Total # Limb Movement 175 Limb Movement Index 34.7  Total # PLMS 172 PLMS Index 34.1  Total # PLMS Arousals 10 PLMS Arousal Index 2.0  Percentage Sleep Time with PLMS 83. (27.6 % sleep)  Mean Duration limb movements (secs) 501.4

## 2024-03-07 LAB — CYCLIC CITRUL PEPTIDE ANTIBODY, IGG/IGA: Cyclic Citrullin Peptide Ab: 10 U (ref 0–19)

## 2024-03-07 LAB — HGB A1C W/O EAG: Hgb A1c MFr Bld: 6.3 % — ABNORMAL HIGH (ref 4.8–5.6)

## 2024-03-07 LAB — ANA W/REFLEX IF POSITIVE: Anti Nuclear Antibody (ANA): NEGATIVE

## 2024-03-07 LAB — SEDIMENTATION RATE: Sed Rate: 8 mm/h (ref 0–40)

## 2024-03-07 LAB — RHEUMATOID FACTOR: Rheumatoid fact SerPl-aCnc: <10 [IU]/mL

## 2024-03-20 ENCOUNTER — Other Ambulatory Visit: Payer: Self-pay

## 2024-03-20 ENCOUNTER — Ambulatory Visit (INDEPENDENT_AMBULATORY_CARE_PROVIDER_SITE_OTHER): Admitting: Physician Assistant

## 2024-03-20 ENCOUNTER — Telehealth: Payer: Self-pay

## 2024-03-20 ENCOUNTER — Encounter: Payer: Self-pay | Admitting: Physician Assistant

## 2024-03-20 VITALS — BP 112/72 | HR 75 | Temp 98.1°F | Resp 16 | Ht 63.0 in | Wt 213.4 lb

## 2024-03-20 DIAGNOSIS — E1165 Type 2 diabetes mellitus with hyperglycemia: Secondary | ICD-10-CM | POA: Diagnosis not present

## 2024-03-20 DIAGNOSIS — E669 Obesity, unspecified: Secondary | ICD-10-CM | POA: Diagnosis not present

## 2024-03-20 DIAGNOSIS — Z1211 Encounter for screening for malignant neoplasm of colon: Secondary | ICD-10-CM

## 2024-03-20 DIAGNOSIS — G4733 Obstructive sleep apnea (adult) (pediatric): Secondary | ICD-10-CM

## 2024-03-20 NOTE — Telephone Encounter (Signed)
 Gastroenterology Pre-Procedure Review   Request Date: 05/11/24 Requesting Physician: Dr. Ole Berkeley   PATIENT REVIEW QUESTIONS: The patient responded to the following health history questions as indicated:     1. Are you having any GI issues? no 2. Do you have a personal history of Polyps? no 3. Do you have a family history of Colon Cancer or Polyps? no 4. Diabetes Mellitus? yes (Has been advised to discuss blood sugar concerns with pcp prior to colonoscopy preparation.  Stop metformin  2 days prior and stop glyxambi  3 days prior) 5. Joint replacements in the past 12 months?no 6. Major health problems in the past 3 months?no 7. Any artificial heart valves, MVP, or defibrillator?no    MEDICATIONS & ALLERGIES:    Patient reports the following regarding taking any anticoagulation/antiplatelet therapy:   Plavix, Coumadin, Eliquis, Xarelto, Lovenox, Pradaxa, Brilinta, or Effient? no Aspirin? no    Patient confirms/reports the following medications:  Current Outpatient Medications  Medication Sig Dispense Refill   Accu-Chek Softclix Lancets lancets Use as instructed once daily  DxE11.65 100 each 1   Blood Glucose Monitoring Suppl (ACCU-CHEK GUIDE ME) w/Device KIT USE AS DIRECTED. E11.65 1 kit 0   gabapentin (NEURONTIN) 300 MG capsule Take 300 mg by mouth at bedtime.     glucose blood (ACCU-CHEK GUIDE TEST) test strip Use as instructed once daily  Dx E11.65 100 each 1   GLYXAMBI  25-5 MG TABS Take 1 tablet by mouth every morning. 30 tablet 3   meloxicam  (MOBIC ) 15 MG tablet TAKE 1 TABLET (15 MG TOTAL) BY MOUTH DAILY. 60 tablet 1   metFORMIN  (GLUCOPHAGE ) 1000 MG tablet Take 1 tablet (1,000 mg total) by mouth 2 (two) times daily. 30 tablet 3   omeprazole  (PRILOSEC) 20 MG capsule TAKE 1 CAPSULE (20 MG TOTAL) BY MOUTH 2 (TWO) TIMES DAILY BEFORE A MEAL. 180 capsule 2   ondansetron  (ZOFRAN ) 4 MG tablet Take 1 tablet (4 mg total) by mouth every 8 (eight) hours as needed for nausea or vomiting. 45 tablet 0    simvastatin  (ZOCOR ) 40 MG tablet Take 1 tablet (40 mg total) by mouth at bedtime. 30 tablet 3   Vitamin D , Ergocalciferol , (DRISDOL ) 1.25 MG (50000 UNIT) CAPS capsule Take 1 capsule (50,000 Units total) by mouth once a week. 5 capsule 3   No current facility-administered medications for this visit.    Patient confirms/reports the following allergies:  No Known Allergies  No orders of the defined types were placed in this encounter.   AUTHORIZATION INFORMATION Primary Insurance: 1D#: Group #:  Secondary Insurance: 1D#: Group #:  SCHEDULE INFORMATION: Date: 05/11/24 Time: Location: [facility]

## 2024-03-20 NOTE — Progress Notes (Signed)
 [facility] [address] [place], [place] [address]  Internal MEDICINE  Office Visit Note  Patient Name: Yolanda Reid  [id number]  [id number]  Date of Service: 03/20/2024  Chief Complaint  Patient presents with   Follow-up    Review Labs   Hyperlipidemia   Quality Metric Gaps    Pneumonia Vaccine    HPI Pt is here for routine follow up -Labs reviewed-auto-immune/inflammatory labs normal, A1c controlled at 6.3 -PSG-mod OSA overall, but severe REM with significant desats. She states she had her cpap titration on Friday--results not available yet. Ready to move forward with treatment  -really changing eating habits, has been losing weight. Down 19lbs. Eating healthy proteins and veggies now. Cut out "junk food" -carpal tunnel surgery cancelled, bc not feeling as much pain since not eating poorly now. So holding off on surgery -Will reschedule colonoscopy as well, had been cancelled due to carpal tunnel surgery so close to it originally but now will move forward with it  Current Medication: Outpatient Encounter Medications as of 03/20/2024  Medication Sig   Accu-Chek Softclix Lancets lancets Use as instructed once daily  DxE11.65   Blood Glucose Monitoring Suppl (ACCU-CHEK GUIDE ME) w/Device KIT USE AS DIRECTED. E11.65   gabapentin (NEURONTIN) 300 MG capsule Take 300 mg by mouth at bedtime.   glucose blood (ACCU-CHEK GUIDE TEST) test strip Use as instructed once daily  Dx E11.65   GLYXAMBI  25-5 MG TABS Take 1 tablet by mouth every morning.   meloxicam  (MOBIC ) 15 MG tablet TAKE 1 TABLET (15 MG TOTAL) BY MOUTH DAILY.   metFORMIN  (GLUCOPHAGE ) 1000 MG tablet Take 1 tablet (1,000 mg total) by mouth 2 (two) times daily.   omeprazole  (PRILOSEC) 20 MG capsule TAKE 1 CAPSULE (20 MG TOTAL) BY MOUTH 2 (TWO) TIMES DAILY BEFORE A MEAL.   ondansetron  (ZOFRAN ) 4 MG tablet Take 1 tablet (4 mg total) by mouth every 8 (eight) hours as needed for nausea or vomiting.   simvastatin   (ZOCOR ) 40 MG tablet Take 1 tablet (40 mg total) by mouth at bedtime.   Vitamin D , Ergocalciferol , (DRISDOL ) 1.25 MG (50000 UNIT) CAPS capsule Take 1 capsule (50,000 Units total) by mouth once a week.   No facility-administered encounter medications on file as of 03/20/2024.    Surgical History: Past Surgical History:  Procedure Laterality Date   ABDOMINAL HYSTERECTOMY  2000   BREAST SURGERY  1994   CESAREAN SECTION  1995   LAPAROSCOPIC GASTRIC BAND REMOVAL WITH LAPAROSCOPIC GASTRIC SLEEVE RESECTION  2017   SKIN BIOPSY  2010   Right butt cheek    Medical History: Past Medical History:  Diagnosis Date   Diabetes mellitus without complication (HCC)    Hyperlipidemia     Family History: Family History  Problem Relation Age of Onset   Arthritis Mother     Social History   Socioeconomic History   Marital status: Married    Spouse name: Not on file   Number of children: Not on file   Years of education: Not on file   Highest education level: Not on file  Occupational History   Not on file  Tobacco Use   Smoking status: Never   Smokeless tobacco: Never  Substance and Sexual Activity   Alcohol use: Not Currently   Drug use: Never   Sexual activity: Yes  Other Topics Concern   Not on file  Social History Narrative   Not on file   Social Drivers of Health   Financial  Resource Strain: Not on file  Food Insecurity: Not on file  Transportation Needs: Not on file  Physical Activity: Not on file  Stress: Not on file  Social Connections: Not on file  Intimate Partner Violence: Not on file      Review of Systems  Constitutional:  Negative for chills, fatigue and unexpected weight change.  HENT:  Negative for congestion, postnasal drip, rhinorrhea, sneezing and sore throat.   Eyes:  Negative for redness.  Respiratory:  Negative for cough, chest tightness and shortness of breath.   Cardiovascular:  Negative for chest pain and palpitations.  Gastrointestinal:   Negative for abdominal pain, constipation, diarrhea, nausea and vomiting.  Genitourinary:  Negative for dysuria and frequency.  Musculoskeletal:  Positive for arthralgias. Negative for back pain, joint swelling and neck pain.  Skin:  Negative for rash.  Neurological: Negative.  Negative for tremors and numbness.  Hematological:  Negative for adenopathy. Does not bruise/bleed easily.  Psychiatric/Behavioral:  Positive for sleep disturbance. Negative for behavioral problems (Depression) and suicidal ideas. The patient is not nervous/anxious.     Vital Signs: BP 112/72   Pulse 75   Temp 98.1 F (36.7 C)   Resp 16   Ht 5\' 3"  (1.6 m)   Wt 213 lb 6.4 oz (96.8 kg)   SpO2 96%   BMI 37.80 kg/m    Physical Exam Vitals and nursing note reviewed.  Constitutional:      Appearance: Normal appearance.  HENT:     Head: Normocephalic and atraumatic.  Eyes:     Pupils: Pupils are equal, round, and reactive to light.  Cardiovascular:     Rate and Rhythm: Normal rate and regular rhythm.  Pulmonary:     Effort: Pulmonary effort is normal.     Breath sounds: Normal breath sounds.  Skin:    General: Skin is warm and dry.  Neurological:     General: No focal deficit present.     Mental Status: She is alert.  Psychiatric:        Mood and Affect: Mood normal.        Behavior: Behavior normal.        Assessment/Plan: 1. OSA (obstructive sleep apnea) (Primary) Recently has titration and will mvoe forward with PAP treatment once results finalized with recommendations.  2. Type 2 diabetes mellitus with hyperglycemia, without long-term current use of insulin (HCC) A1c controlled at 6.3, continue current medication and working on diet and exercise  3. Obesity (BMI 30-39.9) Down 19lbs since last visit and is really working on diet changes. Plans to start exercising as well.   General Counseling: Shannie verbalizes understanding of the findings of todays visit and agrees with plan of  treatment. I have discussed any further diagnostic evaluation that may be needed or ordered today. We also reviewed her medications today. she has been encouraged to call the office with any questions or concerns that should arise related to todays visit.    No orders of the defined types were placed in this encounter.   No orders of the defined types were placed in this encounter.   This patient was seen by Taylor Favia, PA-C in collaboration with Dr. Verneta Gone as a part of collaborative care agreement.   Total time spent:30 Minutes Time spent includes review of chart, medications, test results, and follow up plan with the patient.      Dr Fozia M Khan Internal medicine

## 2024-04-08 ENCOUNTER — Other Ambulatory Visit: Payer: Self-pay | Admitting: Physician Assistant

## 2024-04-08 DIAGNOSIS — E782 Mixed hyperlipidemia: Secondary | ICD-10-CM

## 2024-04-11 ENCOUNTER — Telehealth: Payer: Self-pay

## 2024-04-11 NOTE — Telephone Encounter (Signed)
 Pt contacted office to reschedule her colonoscopy with Dr. Ole Berkeley.  Colonoscopy has been rescheduled to 06/08/24.  Vickie in Endo has been notified of date change. Referral updated.  Pt stated that she will use her same instructions from previous reschedule.  Thanks, [place], [place]

## 2024-04-21 ENCOUNTER — Encounter: Payer: Self-pay | Admitting: Physician Assistant

## 2024-04-21 ENCOUNTER — Ambulatory Visit (INDEPENDENT_AMBULATORY_CARE_PROVIDER_SITE_OTHER): Admitting: Physician Assistant

## 2024-04-21 VITALS — BP 122/88 | HR 65 | Temp 97.9°F | Resp 16 | Ht 63.0 in | Wt 206.3 lb

## 2024-04-21 DIAGNOSIS — F40243 Fear of flying: Secondary | ICD-10-CM

## 2024-04-21 DIAGNOSIS — E669 Obesity, unspecified: Secondary | ICD-10-CM | POA: Diagnosis not present

## 2024-04-21 MED ORDER — ALPRAZOLAM 0.25 MG PO TABS
ORAL_TABLET | ORAL | 0 refills | Status: DC
Start: 2024-04-21 — End: 2024-08-25

## 2024-04-21 NOTE — Progress Notes (Cosign Needed)
 [facility] [address] [place], [place] [address]  Internal MEDICINE  Office Visit Note  Patient Name: Yolanda Reid  [id number]  [id number]  Date of Service: 04/21/2024  Chief Complaint  Patient presents with   Follow-up   Medication Refill    Xanax for travel    HPI Pt is here for follow up to discuss medication -states she is a nervous flier and used to take low dose xanax when flying to help this. -She typically avoids flying for this reason -unfortunately her husband's family member is in the hospital and they are flying to see her -down 7lbs since last visit, really changed diet and is still doing well with this  Current Medication: Outpatient Encounter Medications as of 04/21/2024  Medication Sig   Accu-Chek Softclix Lancets lancets Use as instructed once daily  DxE11.65   ALPRAZolam (XANAX) 0.25 MG tablet Half to one tab tab as needed for anxiety when flying   Blood Glucose Monitoring Suppl (ACCU-CHEK GUIDE ME) w/Device KIT USE AS DIRECTED. E11.65   gabapentin (NEURONTIN) 300 MG capsule Take 300 mg by mouth at bedtime.   glucose blood (ACCU-CHEK GUIDE TEST) test strip Use as instructed once daily  Dx E11.65   GLYXAMBI  25-5 MG TABS Take 1 tablet by mouth every morning.   meloxicam  (MOBIC ) 15 MG tablet TAKE 1 TABLET (15 MG TOTAL) BY MOUTH DAILY.   metFORMIN  (GLUCOPHAGE ) 1000 MG tablet Take 1 tablet (1,000 mg total) by mouth 2 (two) times daily.   omeprazole  (PRILOSEC) 20 MG capsule TAKE 1 CAPSULE (20 MG TOTAL) BY MOUTH 2 (TWO) TIMES DAILY BEFORE A MEAL.   ondansetron  (ZOFRAN ) 4 MG tablet Take 1 tablet (4 mg total) by mouth every 8 (eight) hours as needed for nausea or vomiting.   simvastatin  (ZOCOR ) 40 MG tablet TAKE 1 TABLET BY MOUTH EVERYDAY AT BEDTIME   Vitamin D , Ergocalciferol , (DRISDOL ) 1.25 MG (50000 UNIT) CAPS capsule Take 1 capsule (50,000 Units total) by mouth once a week.   No facility-administered encounter medications on file as of  04/21/2024.    Surgical History: Past Surgical History:  Procedure Laterality Date   ABDOMINAL HYSTERECTOMY  2000   BREAST SURGERY  1994   CESAREAN SECTION  1995   LAPAROSCOPIC GASTRIC BAND REMOVAL WITH LAPAROSCOPIC GASTRIC SLEEVE RESECTION  2017   SKIN BIOPSY  2010   Right butt cheek    Medical History: Past Medical History:  Diagnosis Date   Diabetes mellitus without complication (HCC)    Hyperlipidemia     Family History: Family History  Problem Relation Age of Onset   Arthritis Mother     Social History   Socioeconomic History   Marital status: Married    Spouse name: Not on file   Number of children: Not on file   Years of education: Not on file   Highest education level: Not on file  Occupational History   Not on file  Tobacco Use   Smoking status: Never   Smokeless tobacco: Never  Substance and Sexual Activity   Alcohol use: Not Currently   Drug use: Never   Sexual activity: Yes  Other Topics Concern   Not on file  Social History Narrative   Not on file   Social Drivers of Health   Financial Resource Strain: Not on file  Food Insecurity: Not on file  Transportation Needs: Not on file  Physical Activity: Not on file  Stress: Not on file  Social Connections: Not on file  Intimate Partner Violence: Not on file      Review of Systems  Constitutional:  Negative for chills, fatigue and unexpected weight change.  HENT:  Negative for congestion, postnasal drip, rhinorrhea, sneezing and sore throat.   Eyes:  Negative for redness.  Respiratory:  Negative for cough, chest tightness and shortness of breath.   Cardiovascular:  Negative for chest pain and palpitations.  Gastrointestinal:  Negative for abdominal pain, constipation, diarrhea, nausea and vomiting.  Genitourinary:  Negative for dysuria and frequency.  Musculoskeletal:  Positive for arthralgias. Negative for back pain, joint swelling and neck pain.  Skin:  Negative for rash.  Neurological:  Negative.  Negative for tremors and numbness.  Hematological:  Negative for adenopathy. Does not bruise/bleed easily.  Psychiatric/Behavioral:  Negative for behavioral problems (Depression) and suicidal ideas. The patient is nervous/anxious.     Vital Signs: BP 122/88 Comment: 130/90  Pulse 65   Temp 97.9 F (36.6 C)   Resp 16   Ht 5' 3 (1.6 m)   Wt 206 lb 4.8 oz (93.6 kg)   SpO2 99%   BMI 36.54 kg/m    Physical Exam Vitals and nursing note reviewed.  Constitutional:      Appearance: Normal appearance.  HENT:     Head: Normocephalic and atraumatic.   Eyes:     Extraocular Movements: Extraocular movements intact.    Cardiovascular:     Rate and Rhythm: Normal rate and regular rhythm.  Pulmonary:     Effort: Pulmonary effort is normal.     Breath sounds: Normal breath sounds.   Skin:    General: Skin is warm and dry.   Neurological:     General: No focal deficit present.     Mental Status: She is alert.   Psychiatric:        Mood and Affect: Mood normal.        Behavior: Behavior normal.        Assessment/Plan: 1. Anxiety with flying (Primary) May use xanax as needed for anxiety with flying. Pt aware of potential S/E - ALPRAZolam (XANAX) 0.25 MG tablet; Half to one tab tab as needed for anxiety when flying  Dispense: 5 tablet; Refill: 0 [place] Controlled Substance Database was reviewed by me for overdose risk score (ORS)  2. Obesity (BMI 30-39.9) Down another 7lbs since last visit and will continue to work on diet and exercise   General Counseling: Yolanda Reid verbalizes understanding of the findings of todays visit and agrees with plan of treatment. I have discussed any further diagnostic evaluation that may be needed or ordered today. We also reviewed her medications today. she has been encouraged to call the office with any questions or concerns that should arise related to todays visit.    No orders of the defined types were placed in this  encounter.   Meds ordered this encounter  Medications   ALPRAZolam (XANAX) 0.25 MG tablet    Sig: Half to one tab tab as needed for anxiety when flying    Dispense:  5 tablet    Refill:  0    This patient was seen by Yolanda Favia, PA-C in collaboration with Dr. Verneta Gone as a part of collaborative care agreement.   Total time spent:25 Minutes Time spent includes review of chart, medications, test results, and follow up plan with the patient.      Dr Fozia M Khan Internal medicine

## 2024-05-19 ENCOUNTER — Other Ambulatory Visit: Payer: Self-pay | Admitting: Physician Assistant

## 2024-05-23 ENCOUNTER — Telehealth: Payer: Self-pay

## 2024-05-23 NOTE — Telephone Encounter (Signed)
 Please review I don't find vitamin d  result

## 2024-05-23 NOTE — Telephone Encounter (Signed)
 Pt advised that she can take OTC vitamin D  as per lauren

## 2024-05-29 ENCOUNTER — Telehealth: Payer: Self-pay

## 2024-05-29 MED ORDER — NA SULFATE-K SULFATE-MG SULF 17.5-3.13-1.6 GM/177ML PO SOLN
354.0000 mL | Freq: Once | ORAL | 0 refills | Status: AC
Start: 2024-05-29 — End: 2024-05-29

## 2024-05-29 NOTE — Telephone Encounter (Signed)
 Patient called wanting to get some questions answered in reference to her upcoming colonoscopy on 06/08/2024. They were answered and patient had no further questions.

## 2024-06-08 ENCOUNTER — Encounter
Admission: RE | Disposition: A | Discharge status: Home / Self Care | Payer: Self-pay | Source: Home / Self Care | Attending: Gastroenterology

## 2024-06-08 ENCOUNTER — Encounter: Payer: Self-pay | Admitting: Gastroenterology

## 2024-06-08 ENCOUNTER — Ambulatory Visit: Admitting: Certified Registered"

## 2024-06-08 ENCOUNTER — Other Ambulatory Visit: Payer: Self-pay

## 2024-06-08 ENCOUNTER — Ambulatory Visit
Admission: RE | Admit: 2024-06-08 | Discharge: 2024-06-08 | Disposition: A | Discharge status: Home / Self Care | Attending: Gastroenterology | Admitting: Gastroenterology

## 2024-06-08 ENCOUNTER — Other Ambulatory Visit: Payer: Self-pay | Admitting: Podiatry

## 2024-06-08 DIAGNOSIS — E66813 Obesity, class 3: Secondary | ICD-10-CM | POA: Insufficient documentation

## 2024-06-08 DIAGNOSIS — K640 First degree hemorrhoids: Secondary | ICD-10-CM | POA: Diagnosis not present

## 2024-06-08 DIAGNOSIS — D125 Benign neoplasm of sigmoid colon: Secondary | ICD-10-CM | POA: Insufficient documentation

## 2024-06-08 DIAGNOSIS — Z6836 Body mass index (BMI) 36.0-36.9, adult: Secondary | ICD-10-CM | POA: Insufficient documentation

## 2024-06-08 DIAGNOSIS — K635 Polyp of colon: Secondary | ICD-10-CM | POA: Diagnosis not present

## 2024-06-08 DIAGNOSIS — E119 Type 2 diabetes mellitus without complications: Secondary | ICD-10-CM | POA: Insufficient documentation

## 2024-06-08 DIAGNOSIS — Z1211 Encounter for screening for malignant neoplasm of colon: Secondary | ICD-10-CM | POA: Diagnosis not present

## 2024-06-08 DIAGNOSIS — K6389 Other specified diseases of intestine: Secondary | ICD-10-CM | POA: Diagnosis not present

## 2024-06-08 DIAGNOSIS — Z7984 Long term (current) use of oral hypoglycemic drugs: Secondary | ICD-10-CM | POA: Insufficient documentation

## 2024-06-08 HISTORY — PX: COLONOSCOPY: SHX5424

## 2024-06-08 HISTORY — PX: POLYPECTOMY: SHX149

## 2024-06-08 LAB — GLUCOSE, CAPILLARY: Glucose-Capillary: 95 mg/dL (ref 70–99)

## 2024-06-08 SURGERY — COLONOSCOPY
Anesthesia: General

## 2024-06-08 MED ORDER — PROPOFOL 500 MG/50ML IV EMUL
INTRAVENOUS | Status: DC | PRN
Start: 2024-06-08 — End: 2024-06-08
  Administered 2024-06-08: 50 mg via INTRAVENOUS
  Administered 2024-06-08: 150 ug/kg/min via INTRAVENOUS

## 2024-06-08 MED ORDER — SODIUM CHLORIDE 0.9 % IV SOLN: INTRAVENOUS | Status: DC

## 2024-06-08 MED ORDER — LIDOCAINE HCL (CARDIAC) PF 100 MG/5ML IV SOSY
PREFILLED_SYRINGE | INTRAVENOUS | Status: DC | PRN
  Administered 2024-06-08: 100 mg via INTRAVENOUS

## 2024-06-08 NOTE — Anesthesia Postprocedure Evaluation (Signed)
 Anesthesia Post Note  Patient: Yolanda Reid  Procedure(s) Performed: COLONOSCOPY POLYPECTOMY, INTESTINE  Patient location during evaluation: PACU Anesthesia Type: General Level of consciousness: awake Pain management: satisfactory to patient Vital Signs Assessment: post-procedure vital signs reviewed and stable Respiratory status: spontaneous breathing Cardiovascular status: blood pressure returned to baseline Anesthetic complications: no   There were no known notable events for this encounter.   Last Vitals:  Vitals:   06/08/24 0951 06/08/24 1001  BP: 110/64 129/74  Pulse: (!) 52 (!) 50  Resp: 17 15  Temp:    SpO2: 98% 100%    Last Pain:  Vitals:   06/08/24 1001  TempSrc:   PainSc: 0-No pain                 VAN STAVEREN,Lashan Gluth

## 2024-06-08 NOTE — Op Note (Signed)
 [facility] Patient Name: Yolanda Reid Procedure Date: 06/08/2024 9:10 AM MRN: [id number] Account #: [id number] Date of Birth: May 24, 1971 Admit Type: Outpatient Age: 53 Room: [facility] ENDO ROOM 4 Gender: Female Note Status: Finalized Instrument Name: Colon Scope [id number] Procedure:             Colonoscopy Indications:           Screening for colorectal malignant neoplasm Providers:             [name] MD, MD Referring MD:          Dionisio Maxwell, [facility] Medicines:             Propofol  per Anesthesia Complications:         No immediate complications. Procedure:             Pre-Anesthesia Assessment:                        - Prior to the procedure, a History and Physical was                         performed, and patient medications and allergies were                         reviewed. The patient's tolerance of previous                         anesthesia was also reviewed. The risks and benefits                         of the procedure and the sedation options and risks                         were discussed with the patient. All questions were                         answered, and informed consent was obtained. Prior                         Anticoagulants: The patient has taken no anticoagulant                         or antiplatelet agents. ASA Grade Assessment: II - A                         patient with mild systemic disease. After reviewing                         the risks and benefits, the patient was deemed in                         satisfactory condition to undergo the procedure.                        After obtaining informed consent, the colonoscope was                         passed under direct vision. Throughout the procedure,  the patient's blood pressure, pulse, and oxygen                         saturations were monitored continuously. The                         Colonoscope was introduced through the anus and                          advanced to the the cecum, identified by appendiceal                         orifice and ileocecal valve. The colonoscopy was                         performed without difficulty. The patient tolerated                         the procedure well. The quality of the bowel                         preparation was excellent. Findings:      The perianal and digital rectal examinations were normal.      Two sessile polyps were found in the transverse colon. The polyps were 3       to 4 mm in size. These polyps were removed with a cold snare. Resection       and retrieval were complete.      A 2 mm polyp was found in the sigmoid colon. The polyp was sessile. The       polyp was removed with a cold snare. Resection and retrieval were       complete.      Non-bleeding internal hemorrhoids were found during retroflexion. The       hemorrhoids were Grade I (internal hemorrhoids that do not prolapse). Impression:            - Two 3 to 4 mm polyps in the transverse colon,                         removed with a cold snare. Resected and retrieved.                        - One 2 mm polyp in the sigmoid colon, removed with a                         cold snare. Resected and retrieved.                        - Non-bleeding internal hemorrhoids. Recommendation:        - Discharge patient to home.                        - Resume previous diet.                        - Continue present medications.                        - Await pathology results.                        -  If the pathology report reveals adenomatous tissue,                         then repeat the colonoscopy for surveillance in 5                         years. Procedure Code(s):     --- Professional ---                        [id number], Colonoscopy, flexible; with removal of                         tumor(s), polyp(s), or other lesion(s) by snare                         technique Diagnosis Code(s):     --- Professional  ---                        Z12.11, Encounter for screening for malignant neoplasm                         of colon                        D12.5, Benign neoplasm of sigmoid colon CPT copyright 2022 American Medical Association. All rights reserved. The codes documented in this report are preliminary and upon coder review may  be revised to meet current compliance requirements. [name] MD, MD 06/08/2024 9:40:24 AM This report has been signed electronically. Number of Addenda: 0 Note Initiated On: 06/08/2024 9:10 AM Scope Withdrawal Time: 0 hours 9 minutes 10 seconds  Total Procedure Duration: 0 hours 11 minutes 11 seconds  Estimated Blood Loss:  Estimated blood loss: none.      [facility]

## 2024-06-08 NOTE — H&P (Signed)
 Yolanda Copping, MD [facility] [address] [place], [place] [address] Phone: [phone] Fax : [phone]  Primary Care Physician:  Kristina Tinnie POUR, PA-C Primary Gastroenterologist:  Dr. Copping  Pre-Procedure History & Physical: HPI:  Yolanda Reid is a 53 y.o. female is here for a screening colonoscopy.   Past Medical History:  Diagnosis Date   Diabetes mellitus without complication (HCC)    Hyperlipidemia     Past Surgical History:  Procedure Laterality Date   ABDOMINAL HYSTERECTOMY  2000   BREAST SURGERY  1994   CESAREAN SECTION  1995   LAPAROSCOPIC GASTRIC BAND REMOVAL WITH LAPAROSCOPIC GASTRIC SLEEVE RESECTION  2017   SKIN BIOPSY  2010   Right butt cheek    Prior to Admission medications   Medication Sig Start Date End Date Taking? Authorizing Provider  gabapentin (NEURONTIN) 300 MG capsule Take 300 mg by mouth at bedtime.   Yes [provider]  GLYXAMBI  25-5 MG TABS Take 1 tablet by mouth every morning. 07/09/23  Yes McDonough, Lauren K, PA-C  metFORMIN  (GLUCOPHAGE ) 1000 MG tablet Take 1 tablet (1,000 mg total) by mouth 2 (two) times daily. 07/09/23  Yes McDonough, Lauren K, PA-C  omeprazole  (PRILOSEC) 20 MG capsule TAKE 1 CAPSULE (20 MG TOTAL) BY MOUTH 2 (TWO) TIMES DAILY BEFORE A MEAL. 12/02/23  Yes McDonough, Lauren K, PA-C  simvastatin  (ZOCOR ) 40 MG tablet TAKE 1 TABLET BY MOUTH EVERYDAY AT BEDTIME 04/10/24  Yes McDonough, Lauren K, PA-C  Vitamin D , Ergocalciferol , (DRISDOL ) 1.25 MG (50000 UNIT) CAPS capsule Take 1 capsule (50,000 Units total) by mouth once a week. 10/21/23  Yes McDonough, Tinnie POUR, PA-C  Accu-Chek Softclix Lancets lancets Use as instructed once daily  DxE11.65 01/12/24   McDonough, Lauren K, PA-C  ALPRAZolam  (XANAX ) 0.25 MG tablet Half to one tab tab as needed for anxiety when flying 04/21/24   McDonough, Lauren K, PA-C  Blood Glucose Monitoring Suppl (ACCU-CHEK GUIDE ME) w/Device KIT USE AS DIRECTED. E11.65 11/05/23   McDonough, Lauren K,  PA-C  glucose blood (ACCU-CHEK GUIDE TEST) test strip Use as instructed once daily  Dx E11.65 01/12/24   McDonough, Lauren K, PA-C  meloxicam  (MOBIC ) 15 MG tablet TAKE 1 TABLET (15 MG TOTAL) BY MOUTH DAILY. 01/13/24   Janit Thresa HERO, DPM  ondansetron  (ZOFRAN ) 4 MG tablet Take 1 tablet (4 mg total) by mouth every 8 (eight) hours as needed for nausea or vomiting. 08/31/23   Fernand Sigrid HERO, MD    Allergies as of 03/20/2024   (No Known Allergies)    Family History  Problem Relation Age of Onset   Arthritis Mother     Social History   Socioeconomic History   Marital status: Married    Spouse name: Not on file   Number of children: Not on file   Years of education: Not on file   Highest education level: Not on file  Occupational History   Not on file  Tobacco Use   Smoking status: Never   Smokeless tobacco: Never  Substance and Sexual Activity   Alcohol use: Not Currently   Drug use: Never   Sexual activity: Yes  Other Topics Concern   Not on file  Social History Narrative   Not on file   Social Drivers of Health   Financial Resource Strain: Not on file  Food Insecurity: Not on file  Transportation Needs: Not on file  Physical Activity: Not on file  Stress: Not on file  Social Connections: Not  on file  Intimate Partner Violence: Not on file    Review of Systems: See HPI, otherwise negative ROS  Physical Exam: BP 127/72   Pulse (!) 55   Temp (!) 96.4 F (35.8 C) (Temporal)   Resp 14   Ht 5' 2 (1.575 m)   Wt 89.8 kg   SpO2 100%   BMI 36.21 kg/m  General:   Alert,  pleasant and cooperative in NAD Head:  Normocephalic and atraumatic. Neck:  Supple; no masses or thyromegaly. Lungs:  Clear throughout to auscultation.    Heart:  Regular rate and rhythm. Abdomen:  Soft, nontender and nondistended. Normal bowel sounds, without guarding, and without rebound.   Neurologic:  Alert and  oriented x4;  grossly normal neurologically.  Impression/Plan: Yolanda Reid  is now here to undergo a screening colonoscopy.  Risks, benefits, and alternatives regarding colonoscopy have been reviewed with the patient.  Questions have been answered.  All parties agreeable.

## 2024-06-08 NOTE — Transfer of Care (Signed)
 Immediate Anesthesia Transfer of Care Note  Patient: Yolanda Reid  Procedure(s) Performed: COLONOSCOPY POLYPECTOMY, INTESTINE  Patient Location: PACU  Anesthesia Type:General  Level of Consciousness: awake and patient cooperative  Airway & Oxygen Therapy: Patient Spontanous Breathing  Post-op Assessment: Report given to RN and Post -op Vital signs reviewed and stable  Post vital signs: stable  Last Vitals:  Vitals Value Taken Time  BP    Temp    Pulse 62 06/08/24 09:41  Resp 11 06/08/24 09:41  SpO2 98 % 06/08/24 09:41  Vitals shown include unfiled device data.  Last Pain:  Vitals:   06/08/24 0845  TempSrc: Temporal  PainSc: 0-No pain         Complications: No notable events documented.

## 2024-06-08 NOTE — Anesthesia Preprocedure Evaluation (Signed)
 Anesthesia Evaluation  Patient identified by MRN, date of birth, ID band Patient awake    Reviewed: Allergy & Precautions, NPO status , Patient's Chart, lab work & pertinent test results  Airway Mallampati: II  TM Distance: >3 FB Neck ROM: Full    Dental  (+) Teeth Intact   Pulmonary neg pulmonary ROS   Pulmonary exam normal breath sounds clear to auscultation       Cardiovascular Exercise Tolerance: Good negative cardio ROS Normal cardiovascular exam Rhythm:Regular Rate:Normal     Neuro/Psych negative neurological ROS  negative psych ROS   GI/Hepatic negative GI ROS, Neg liver ROS,,,  Endo/Other  negative endocrine ROSdiabetes, Well Controlled, Type 2  Class 3 obesity  Renal/GU negative Renal ROS  negative genitourinary   Musculoskeletal   Abdominal  (+) + obese  Peds negative pediatric ROS (+)  Hematology negative hematology ROS (+)   Anesthesia Other Findings Past Medical History: No date: Diabetes mellitus without complication (HCC) No date: Hyperlipidemia  Past Surgical History: 2000: ABDOMINAL HYSTERECTOMY 1994: BREAST SURGERY 1995: CESAREAN SECTION 2017: LAPAROSCOPIC GASTRIC BAND REMOVAL WITH LAPAROSCOPIC GASTRIC  SLEEVE RESECTION 2010: SKIN BIOPSY     Comment:  Right butt cheek  BMI    Body Mass Index: 36.21 kg/m      Reproductive/Obstetrics negative OB ROS                              Anesthesia Physical Anesthesia Plan  ASA: 2  Anesthesia Plan: General   Post-op Pain Management:    Induction: Intravenous  PONV Risk Score and Plan: Propofol  infusion and TIVA  Airway Management Planned: Natural Airway and Nasal Cannula  Additional Equipment:   Intra-op Plan:   Post-operative Plan:   Informed Consent: I have reviewed the patients History and Physical, chart, labs and discussed the procedure including the risks, benefits and alternatives for the  proposed anesthesia with the patient or authorized representative who has indicated his/her understanding and acceptance.     Dental Advisory Given  Plan Discussed with: CRNA  Anesthesia Plan Comments:         Anesthesia Quick Evaluation

## 2024-06-09 LAB — SURGICAL PATHOLOGY

## 2024-06-11 ENCOUNTER — Ambulatory Visit: Payer: Self-pay | Admitting: Gastroenterology

## 2024-06-23 ENCOUNTER — Ambulatory Visit: Payer: Self-pay | Admitting: Podiatry

## 2024-07-20 ENCOUNTER — Ambulatory Visit (INDEPENDENT_AMBULATORY_CARE_PROVIDER_SITE_OTHER): Admitting: Physician Assistant

## 2024-07-20 ENCOUNTER — Encounter: Payer: Self-pay | Admitting: Physician Assistant

## 2024-07-20 VITALS — BP 121/75 | HR 55 | Temp 98.0°F | Resp 16 | Ht 63.0 in | Wt 195.0 lb

## 2024-07-20 DIAGNOSIS — Z1329 Encounter for screening for other suspected endocrine disorder: Secondary | ICD-10-CM

## 2024-07-20 DIAGNOSIS — E559 Vitamin D deficiency, unspecified: Secondary | ICD-10-CM

## 2024-07-20 DIAGNOSIS — E1165 Type 2 diabetes mellitus with hyperglycemia: Secondary | ICD-10-CM

## 2024-07-20 DIAGNOSIS — E782 Mixed hyperlipidemia: Secondary | ICD-10-CM

## 2024-07-20 DIAGNOSIS — E538 Deficiency of other specified B group vitamins: Secondary | ICD-10-CM

## 2024-07-20 DIAGNOSIS — E669 Obesity, unspecified: Secondary | ICD-10-CM

## 2024-07-20 DIAGNOSIS — R5383 Other fatigue: Secondary | ICD-10-CM

## 2024-07-20 DIAGNOSIS — Z7189 Other specified counseling: Secondary | ICD-10-CM

## 2024-07-20 DIAGNOSIS — G4733 Obstructive sleep apnea (adult) (pediatric): Secondary | ICD-10-CM | POA: Diagnosis not present

## 2024-07-20 NOTE — Progress Notes (Signed)
 [facility] [address] [place], [place] [address]  Internal MEDICINE  Office Visit Note  Patient Name: Yolanda Reid  [id number]  [id number]  Date of Service: 07/20/2024  Chief Complaint  Patient presents with   Follow-up   Diabetes   Hyperlipidemia    HPI Pt is here for routine follow up -Down 11lbs since June -colonoscopy done -mammogram done -BG doing well, highest 126 -carpal tunnel still bothersome, did not get surgery and does agree it is carapl tunnel based on symotoms. -would like to update labs -using cpap nightly, states around 2-3am mask starts leaking. She will adjust but still leaking and wonders if due to sweating at night. Will try mask covers to help. She is using PAP and benefiting from use.  CPAP download 06/20/24-07/19/24 Usage 29/30 days > 4 hours 73% Avg use 5hours Settings APAP 9-16, 95th percentile pressure 13.6 Leak 95th 0.8 AHI 0.9, obstructive 0.6, central 0.1  Current Medication: Outpatient Encounter Medications as of 07/20/2024  Medication Sig   Accu-Chek Softclix Lancets lancets Use as instructed once daily  DxE11.65   ALPRAZolam  (XANAX ) 0.25 MG tablet Half to one tab tab as needed for anxiety when flying   Blood Glucose Monitoring Suppl (ACCU-CHEK GUIDE ME) w/Device KIT USE AS DIRECTED. E11.65   gabapentin (NEURONTIN) 300 MG capsule Take 300 mg by mouth at bedtime.   glucose blood (ACCU-CHEK GUIDE TEST) test strip Use as instructed once daily  Dx E11.65   GLYXAMBI  25-5 MG TABS Take 1 tablet by mouth every morning.   meloxicam  (MOBIC ) 15 MG tablet TAKE 1 TABLET (15 MG TOTAL) BY MOUTH DAILY.   metFORMIN  (GLUCOPHAGE ) 1000 MG tablet Take 1 tablet (1,000 mg total) by mouth 2 (two) times daily.   omeprazole  (PRILOSEC) 20 MG capsule TAKE 1 CAPSULE (20 MG TOTAL) BY MOUTH 2 (TWO) TIMES DAILY BEFORE A MEAL.   ondansetron  (ZOFRAN ) 4 MG tablet Take 1 tablet (4 mg total) by mouth every 8 (eight) hours as needed for nausea or  vomiting.   simvastatin  (ZOCOR ) 40 MG tablet TAKE 1 TABLET BY MOUTH EVERYDAY AT BEDTIME   Vitamin D , Ergocalciferol , (DRISDOL ) 1.25 MG (50000 UNIT) CAPS capsule Take 1 capsule (50,000 Units total) by mouth once a week.   No facility-administered encounter medications on file as of 07/20/2024.    Surgical History: Past Surgical History:  Procedure Laterality Date   ABDOMINAL HYSTERECTOMY  2000   BREAST SURGERY  1994   CESAREAN SECTION  1995   COLONOSCOPY N/A 06/08/2024   Procedure: COLONOSCOPY;  Surgeon: Jinny Carmine, MD;  Location: [facility] ENDOSCOPY;  Service: Endoscopy;  Laterality: N/A;  PATIENT SAYS SHE DOES NOT NEED INTERPRETER   LAPAROSCOPIC GASTRIC BAND REMOVAL WITH LAPAROSCOPIC GASTRIC SLEEVE RESECTION  2017   POLYPECTOMY  06/08/2024   Procedure: POLYPECTOMY, INTESTINE;  Surgeon: Jinny Carmine, MD;  Location: [facility] ENDOSCOPY;  Service: Endoscopy;;   SKIN BIOPSY  2010   Right butt cheek    Medical History: Past Medical History:  Diagnosis Date   Diabetes mellitus without complication (HCC)    Hyperlipidemia     Family History: Family History  Problem Relation Age of Onset   Arthritis Mother     Social History   Socioeconomic History   Marital status: Married    Spouse name: Not on file   Number of children: Not on file   Years of education: Not on file   Highest education level: Not on file  Occupational History   Not on file  Tobacco Use   Smoking status: Never   Smokeless tobacco: Never  Substance and Sexual Activity   Alcohol use: Not Currently   Drug use: Never   Sexual activity: Yes  Other Topics Concern   Not on file  Social History Narrative   Not on file   Social Drivers of Health   Financial Resource Strain: Not on file  Food Insecurity: Not on file  Transportation Needs: Not on file  Physical Activity: Not on file  Stress: Not on file  Social Connections: Not on file  Intimate Partner Violence: Not on file      Review of Systems   Constitutional:  Negative for chills, fatigue and unexpected weight change.  HENT:  Negative for congestion, postnasal drip, rhinorrhea, sneezing and sore throat.   Eyes:  Negative for redness.  Respiratory:  Negative for cough, chest tightness and shortness of breath.   Cardiovascular:  Negative for chest pain and palpitations.  Gastrointestinal:  Negative for abdominal pain, constipation, diarrhea, nausea and vomiting.  Genitourinary:  Negative for dysuria and frequency.  Musculoskeletal:  Positive for arthralgias. Negative for back pain, joint swelling and neck pain.  Skin:  Negative for rash.  Neurological: Negative.  Negative for tremors and numbness.  Hematological:  Negative for adenopathy. Does not bruise/bleed easily.  Psychiatric/Behavioral:  Positive for sleep disturbance. Negative for behavioral problems (Depression) and suicidal ideas.     Vital Signs: BP 121/75   Pulse (!) 55   Temp 98 F (36.7 C)   Resp 16   Ht 5' 3 (1.6 m)   Wt 195 lb (88.5 kg)   SpO2 97%   BMI 34.54 kg/m    Physical Exam Vitals and nursing note reviewed.  Constitutional:      Appearance: Normal appearance.  HENT:     Head: Normocephalic and atraumatic.  Eyes:     Extraocular Movements: Extraocular movements intact.  Cardiovascular:     Rate and Rhythm: Normal rate and regular rhythm.  Pulmonary:     Effort: Pulmonary effort is normal.     Breath sounds: Normal breath sounds.  Skin:    General: Skin is warm and dry.  Neurological:     General: No focal deficit present.     Mental Status: She is alert.  Psychiatric:        Mood and Affect: Mood normal.        Behavior: Behavior normal.        Assessment/Plan: 1. Type 2 diabetes mellitus with hyperglycemia, without long-term current use of insulin (HCC) (Primary) BG improving, will check A1c with labs - Hgb A1C w/o eAG  2. OSA (obstructive sleep apnea) Continue cpap nightly and encouraged to try mask covers to help with  mask sliding/leak  3. CPAP use counseling CPAP couseling-Discussed importance of adequate CPAP use as well as proper care and cleaning techniques of machine and all supplies.  4. Thyroid disorder screen - TSH + free T4  5. Mixed hyperlipidemia Continue simvastatin  and will update labs - Lipid Panel With LDL/HDL Ratio  6. Vitamin D  deficiency - VITAMIN D  25 Hydroxy (Vit-D Deficiency, Fractures)  7. B12 deficiency - B12 and Folate Panel  8. Other fatigue - CBC w/Diff/Platelet - Comprehensive metabolic panel with GFR - Lipid Panel With LDL/HDL Ratio - TSH + free T4 - Hgb A1C w/o eAG - VITAMIN D  25 Hydroxy (Vit-D Deficiency, Fractures) - B12 and Folate Panel - Fe+TIBC+Fer  9. Obesity (BMI 30-39.9) Down 11lbs since last visit and encouraged  to continue to work on this   General Counseling: Yolanda Reid verbalizes understanding of the findings of todays visit and agrees with plan of treatment. I have discussed any further diagnostic evaluation that may be needed or ordered today. We also reviewed her medications today. she has been encouraged to call the office with any questions or concerns that should arise related to todays visit.    Orders Placed This Encounter  Procedures   CBC w/Diff/Platelet   Comprehensive metabolic panel with GFR   Lipid Panel With LDL/HDL Ratio   TSH + free T4   Hgb A1C w/o eAG   VITAMIN D  25 Hydroxy (Vit-D Deficiency, Fractures)   B12 and Folate Panel   Fe+TIBC+Fer    No orders of the defined types were placed in this encounter.   This patient was seen by Tinnie Pro, PA-C in collaboration with Dr. Sigrid Bathe as a part of collaborative care agreement.   Total time spent:30 Minutes Time spent includes review of chart, medications, test results, and follow up plan with the patient.      Dr Fozia M Khan Internal medicine

## 2024-08-25 ENCOUNTER — Other Ambulatory Visit: Payer: Self-pay | Admitting: Physician Assistant

## 2024-08-25 ENCOUNTER — Telehealth: Payer: Self-pay

## 2024-08-25 DIAGNOSIS — F419 Anxiety disorder, unspecified: Secondary | ICD-10-CM

## 2024-08-25 MED ORDER — ALPRAZOLAM 0.25 MG PO TABS: ORAL_TABLET | ORAL | 0 refills | Status: AC

## 2024-08-28 NOTE — Telephone Encounter (Signed)
 done

## 2024-09-07 ENCOUNTER — Other Ambulatory Visit: Payer: Self-pay | Admitting: Physician Assistant

## 2024-09-15 LAB — LIPID PANEL WITH LDL/HDL RATIO
Cholesterol, Total: 217 mg/dL — ABNORMAL HIGH (ref 100–199)
HDL: 45 mg/dL (ref >39)
LDL Chol Calc (NIH): 160 mg/dL — ABNORMAL HIGH (ref 0–99)
LDL/HDL Ratio: 3.6 ratio — ABNORMAL HIGH (ref 0.0–3.2)
Triglycerides: 67 mg/dL (ref 0–149)
VLDL Cholesterol Cal: 12 mg/dL (ref 5–40)

## 2024-09-15 LAB — B12 AND FOLATE PANEL
Folate: 6.8 ng/mL (ref >3.0)
Vitamin B-12: 404 pg/mL (ref 232–1245)

## 2024-09-15 LAB — CBC WITH DIFFERENTIAL/PLATELET
Basophils Absolute: 0.1 x10E3/uL (ref 0.0–0.2)
Basos: 1 %
EOS (ABSOLUTE): 0.1 x10E3/uL (ref 0.0–0.4)
Eos: 2 %
Hematocrit: 41.3 % (ref 34.0–46.6)
Hemoglobin: 13 g/dL (ref 11.1–15.9)
Immature Grans (Abs): 0 x10E3/uL (ref 0.0–0.1)
Immature Granulocytes: 0 %
Lymphocytes Absolute: 3.1 x10E3/uL (ref 0.7–3.1)
Lymphs: 44 %
MCH: 29.4 pg (ref 26.6–33.0)
MCHC: 31.5 g/dL (ref 31.5–35.7)
MCV: 93 fL (ref 79–97)
Monocytes Absolute: 0.6 x10E3/uL (ref 0.1–0.9)
Monocytes: 8 %
Neutrophils Absolute: 3.2 x10E3/uL (ref 1.4–7.0)
Neutrophils: 45 %
Platelets: 279 x10E3/uL (ref 150–450)
RBC: 4.42 x10E6/uL (ref 3.77–5.28)
RDW: 12.9 % (ref 11.7–15.4)
WBC: 7.1 x10E3/uL (ref 3.4–10.8)

## 2024-09-15 LAB — COMPREHENSIVE METABOLIC PANEL WITH GFR
ALT: 16 IU/L (ref 0–32)
AST: 18 IU/L (ref 0–40)
Albumin: 4.2 g/dL (ref 3.8–4.9)
Alkaline Phosphatase: 70 IU/L (ref 49–135)
BUN/Creatinine Ratio: 39 — ABNORMAL HIGH (ref 9–23)
BUN: 22 mg/dL (ref 6–24)
Bilirubin Total: 0.4 mg/dL (ref 0.0–1.2)
CO2: 25 mmol/L (ref 20–29)
Calcium: 9.7 mg/dL (ref 8.7–10.2)
Chloride: 103 mmol/L (ref 96–106)
Creatinine, Ser: 0.57 mg/dL (ref 0.57–1.00)
Globulin, Total: 2.1 g/dL (ref 1.5–4.5)
Glucose: 93 mg/dL (ref 70–99)
Potassium: 4.1 mmol/L (ref 3.5–5.2)
Sodium: 141 mmol/L (ref 134–144)
Total Protein: 6.3 g/dL (ref 6.0–8.5)
eGFR: 108 mL/min/1.73 (ref >59)

## 2024-09-15 LAB — VITAMIN D 25 HYDROXY (VIT D DEFICIENCY, FRACTURES): Vit D, 25-Hydroxy: 69.5 ng/mL (ref 30.0–100.0)

## 2024-09-15 LAB — IRON,TIBC AND FERRITIN PANEL
Ferritin: 70 ng/mL (ref 15–150)
Iron Saturation: 16 % (ref 15–55)
Iron: 63 ug/dL (ref 27–159)
Total Iron Binding Capacity: 384 ug/dL (ref 250–450)
UIBC: 321 ug/dL (ref 131–425)

## 2024-09-15 LAB — HGB A1C W/O EAG: Hgb A1c MFr Bld: 6 % — ABNORMAL HIGH (ref 4.8–5.6)

## 2024-09-15 LAB — TSH+FREE T4
Free T4: 1.05 ng/dL (ref 0.82–1.77)
TSH: 2.55 u[IU]/mL (ref 0.450–4.500)

## 2024-09-21 ENCOUNTER — Ambulatory Visit (INDEPENDENT_AMBULATORY_CARE_PROVIDER_SITE_OTHER): Admitting: Physician Assistant

## 2024-09-21 ENCOUNTER — Encounter: Payer: Self-pay | Admitting: Physician Assistant

## 2024-09-21 VITALS — BP 105/70 | HR 67 | Temp 98.0°F | Resp 16 | Ht 63.0 in | Wt 188.0 lb

## 2024-09-21 DIAGNOSIS — M25562 Pain in left knee: Secondary | ICD-10-CM | POA: Diagnosis not present

## 2024-09-21 DIAGNOSIS — E1165 Type 2 diabetes mellitus with hyperglycemia: Secondary | ICD-10-CM

## 2024-09-21 DIAGNOSIS — G5601 Carpal tunnel syndrome, right upper limb: Secondary | ICD-10-CM

## 2024-09-21 DIAGNOSIS — E782 Mixed hyperlipidemia: Secondary | ICD-10-CM | POA: Diagnosis not present

## 2024-09-21 DIAGNOSIS — M25551 Pain in right hip: Secondary | ICD-10-CM | POA: Diagnosis not present

## 2024-09-21 DIAGNOSIS — G8929 Other chronic pain: Secondary | ICD-10-CM

## 2024-09-21 MED ORDER — MELOXICAM 15 MG PO TABS: 15.0000 mg | ORAL_TABLET | Freq: Every day | ORAL | 1 refills | Status: AC

## 2024-09-21 MED ORDER — ROSUVASTATIN CALCIUM 5 MG PO TABS
ORAL_TABLET | ORAL | 3 refills | Status: AC
Start: 2024-09-21 — End: ?

## 2024-09-21 NOTE — Progress Notes (Signed)
 [facility] [address] [place], [place] [address]  Internal MEDICINE  Office Visit Note  Patient Name: Yolanda Reid  [id number]  [id number]  Date of Service: 09/21/2024  Chief Complaint  Patient presents with   Follow-up    Review labs - Diabetes    Hip Pain    Low back into hip pain   Knee Pain    Left knee pain, clicking noise    HPI Pt is here for routine follow up -labs reviewed: Cholesterol elevated, A1c improving  -Down 7lb since last visit -left knee and right hip pain. Left knee popping sometimes. Still having right carpal tunnel issues, had planned for surgery but wasn't sure about provider and would like another opinion and to eval new issues. Would like to see [facility] instead  Current Medication: Outpatient Encounter Medications as of 09/21/2024  Medication Sig   Accu-Chek Softclix Lancets lancets Use as instructed once daily  DxE11.65   ALPRAZolam  (XANAX ) 0.25 MG tablet Half to one tab tab as needed for anxiety when flying   Blood Glucose Monitoring Suppl (ACCU-CHEK GUIDE ME) w/Device KIT USE AS DIRECTED. E11.65   gabapentin (NEURONTIN) 300 MG capsule Take 300 mg by mouth at bedtime.   glucose blood (ACCU-CHEK GUIDE TEST) test strip USE AS INSTRUCTED ONCE DAILY   metFORMIN  (GLUCOPHAGE ) 1000 MG tablet Take 1 tablet (1,000 mg total) by mouth 2 (two) times daily.   omeprazole  (PRILOSEC) 20 MG capsule TAKE 1 CAPSULE (20 MG TOTAL) BY MOUTH 2 (TWO) TIMES DAILY BEFORE A MEAL.   ondansetron  (ZOFRAN ) 4 MG tablet Take 1 tablet (4 mg total) by mouth every 8 (eight) hours as needed for nausea or vomiting.   rosuvastatin  (CRESTOR ) 5 MG tablet Take 1 tablet by mouth 2x per week   [DISCONTINUED] GLYXAMBI  25-5 MG TABS Take 1 tablet by mouth every morning.   [DISCONTINUED] meloxicam  (MOBIC ) 15 MG tablet TAKE 1 TABLET (15 MG TOTAL) BY MOUTH DAILY.   [DISCONTINUED] simvastatin  (ZOCOR ) 40 MG tablet TAKE 1 TABLET BY MOUTH EVERYDAY AT BEDTIME   [DISCONTINUED] Vitamin D ,  Ergocalciferol , (DRISDOL ) 1.25 MG (50000 UNIT) CAPS capsule Take 1 capsule (50,000 Units total) by mouth once a week.   meloxicam  (MOBIC ) 15 MG tablet Take 1 tablet (15 mg total) by mouth daily.   No facility-administered encounter medications on file as of 09/21/2024.    Surgical History: Past Surgical History:  Procedure Laterality Date   ABDOMINAL HYSTERECTOMY  2000   BREAST SURGERY  1994   CESAREAN SECTION  1995   COLONOSCOPY N/A 06/08/2024   Procedure: COLONOSCOPY;  Surgeon: Jinny Carmine, MD;  Location[facility] ENDOSCOPY;  Service: Endoscopy;  Laterality: N/A;  PATIENT SAYS SHE DOES NOT NEED INTERPRETER   LAPAROSCOPIC GASTRIC BAND REMOVAL WITH LAPAROSCOPIC GASTRIC SLEEVE RESECTION  2017   POLYPECTOMY  06/08/2024   Procedure: POLYPECTOMY, INTESTINE;  Surgeon: Jinny Carmine, MD;  Location: [facility] ENDOSCOPY;  Service: Endoscopy;;   SKIN BIOPSY  2010   Right butt cheek    Medical History: Past Medical History:  Diagnosis Date   Diabetes mellitus without complication (HCC)    Hyperlipidemia     Family History: Family History  Problem Relation Age of Onset   Arthritis Mother     Social History   Socioeconomic History   Marital status: Married    Spouse name: Not on file   Number of children: Not on file   Years of education: Not on file   Highest education level: Not on file  Occupational History   Not on file  Tobacco Use   Smoking status: Never   Smokeless tobacco: Never  Substance and Sexual Activity   Alcohol use: Not Currently   Drug use: Never   Sexual activity: Yes  Other Topics Concern   Not on file  Social History Narrative   Not on file   Social Drivers of Health   Financial Resource Strain: Not on file  Food Insecurity: Not on file  Transportation Needs: Not on file  Physical Activity: Not on file  Stress: Not on file  Social Connections: Not on file  Intimate Partner Violence: Not on file      Review of Systems  Constitutional:  Negative for  chills, fatigue and unexpected weight change.  HENT:  Negative for congestion, postnasal drip, rhinorrhea, sneezing and sore throat.   Eyes:  Negative for redness.  Respiratory:  Negative for cough, chest tightness and shortness of breath.   Cardiovascular:  Negative for chest pain and palpitations.  Gastrointestinal:  Negative for abdominal pain, constipation, diarrhea, nausea and vomiting.  Genitourinary:  Negative for dysuria and frequency.  Musculoskeletal:  Positive for arthralgias. Negative for back pain, joint swelling and neck pain.  Skin:  Negative for rash.  Neurological: Negative.  Negative for tremors and numbness.  Hematological:  Negative for adenopathy. Does not bruise/bleed easily.  Psychiatric/Behavioral:  Positive for sleep disturbance. Negative for behavioral problems (Depression) and suicidal ideas.     Vital Signs: BP 105/70   Pulse 67   Temp 98 F (36.7 C)   Resp 16   Ht 5' 3 (1.6 m)   Wt 188 lb (85.3 kg)   SpO2 97%   BMI 33.30 kg/m    Physical Exam Vitals and nursing note reviewed.  Constitutional:      Appearance: Normal appearance.  HENT:     Head: Normocephalic and atraumatic.  Eyes:     Extraocular Movements: Extraocular movements intact.  Cardiovascular:     Rate and Rhythm: Normal rate and regular rhythm.  Pulmonary:     Effort: Pulmonary effort is normal.     Breath sounds: Normal breath sounds.  Skin:    General: Skin is warm and dry.  Neurological:     General: No focal deficit present.     Mental Status: She is alert.  Psychiatric:        Mood and Affect: Mood normal.        Behavior: Behavior normal.        Assessment/Plan: 1. Type 2 diabetes mellitus with hyperglycemia, without long-term current use of insulin (HCC) (Primary) A1c is 6.0 which is in prediabetic range, continue metformin  as before  2. Mixed hyperlipidemia Will switch to crestor  and titrate up as able - rosuvastatin  (CRESTOR ) 5 MG tablet; Take 1 tablet by  mouth 2x per week  Dispense: 30 tablet; Refill: 3  3. Acute pain of left knee May take meloxicam  and refer to ortho for ongoing pain and clicking - AMB referral to orthopedics - meloxicam  (MOBIC ) 15 MG tablet; Take 1 tablet (15 mg total) by mouth daily.  Dispense: 30 tablet; Refill: 1  4. Chronic right hip pain - AMB referral to orthopedics - meloxicam  (MOBIC ) 15 MG tablet; Take 1 tablet (15 mg total) by mouth daily.  Dispense: 30 tablet; Refill: 1  5. Carpal tunnel syndrome of right wrist Would like another opinion - AMB referral to orthopedics   General Counseling: Shanecia verbalizes understanding of the findings of todays visit and agrees  with plan of treatment. I have discussed any further diagnostic evaluation that may be needed or ordered today. We also reviewed her medications today. she has been encouraged to call the office with any questions or concerns that should arise related to todays visit.    Orders Placed This Encounter  Procedures   AMB referral to orthopedics    Meds ordered this encounter  Medications   rosuvastatin  (CRESTOR ) 5 MG tablet    Sig: Take 1 tablet by mouth 2x per week    Dispense:  30 tablet    Refill:  3   meloxicam  (MOBIC ) 15 MG tablet    Sig: Take 1 tablet (15 mg total) by mouth daily.    Dispense:  30 tablet    Refill:  1    This patient was seen by Tinnie Pro, PA-C in collaboration with Dr. Sigrid Bathe as a part of collaborative care agreement.   Total time spent:30 Minutes Time spent includes review of chart, medications, test results, and follow up plan with the patient.      Dr Fozia M Khan Internal medicine

## 2024-09-25 ENCOUNTER — Telehealth: Payer: Self-pay | Admitting: Physician Assistant

## 2024-09-25 NOTE — Telephone Encounter (Signed)
 Awaiting 09/21/24 office notes for Orthopedic referral-Toni

## 2024-10-03 ENCOUNTER — Telehealth: Payer: Self-pay | Admitting: Physician Assistant

## 2024-10-03 NOTE — Telephone Encounter (Signed)
 Orthopedic referral sent via [employer] to [facility]. Notified patient. Gave patient telephone # [phone]

## 2024-11-03 ENCOUNTER — Telehealth: Payer: Self-pay | Admitting: Physician Assistant

## 2024-11-03 ENCOUNTER — Telehealth: Payer: Self-pay

## 2024-11-03 NOTE — Telephone Encounter (Signed)
 Orthopedic appointment 11/09/2024 with [facility]-Toni

## 2024-11-03 NOTE — Telephone Encounter (Signed)
 Patient called stating Tuesday she started having joint pain, body aches, chills, coughing and congestion with headaches. Hasn't tested, Spoke to [employer] and she said she can still test if she wants to but she is out of the window to get any tamiflu, she can take over the counter medication, patient was notified.

## 2024-11-04 ENCOUNTER — Emergency Department

## 2024-11-04 ENCOUNTER — Emergency Department: Admission: EM | Admit: 2024-11-04 | Discharge: 2024-11-04 | Disposition: A | Discharge status: Home / Self Care

## 2024-11-04 ENCOUNTER — Encounter: Payer: Self-pay | Admitting: Emergency Medicine

## 2024-11-04 ENCOUNTER — Other Ambulatory Visit: Payer: Self-pay

## 2024-11-04 DIAGNOSIS — N200 Calculus of kidney: Secondary | ICD-10-CM

## 2024-11-04 DIAGNOSIS — R1011 Right upper quadrant pain: Secondary | ICD-10-CM | POA: Diagnosis present

## 2024-11-04 DIAGNOSIS — R10A1 Flank pain, right side: Secondary | ICD-10-CM

## 2024-11-04 DIAGNOSIS — N132 Hydronephrosis with renal and ureteral calculous obstruction: Secondary | ICD-10-CM | POA: Insufficient documentation

## 2024-11-04 LAB — URINALYSIS, COMPLETE (UACMP) WITH MICROSCOPIC
Bilirubin Urine: NEGATIVE
Glucose, UA: NEGATIVE mg/dL
Hgb urine dipstick: NEGATIVE
Ketones, ur: NEGATIVE mg/dL
Nitrite: NEGATIVE
Protein, ur: NEGATIVE mg/dL
Specific Gravity, Urine: 1.025 (ref 1.005–1.030)
pH: 5 (ref 5.0–8.0)

## 2024-11-04 LAB — CBC WITH DIFFERENTIAL/PLATELET
Abs Immature Granulocytes: 0.01 K/uL (ref 0.00–0.07)
Basophils Absolute: 0 K/uL (ref 0.0–0.1)
Basophils Relative: 1 %
Eosinophils Absolute: 0.1 K/uL (ref 0.0–0.5)
Eosinophils Relative: 3 %
HCT: 38.6 % (ref 36.0–46.0)
Hemoglobin: 12.6 g/dL (ref 12.0–15.0)
Immature Granulocytes: 0 %
Lymphocytes Relative: 41 %
Lymphs Abs: 1.7 K/uL (ref 0.7–4.0)
MCH: 29.5 pg (ref 26.0–34.0)
MCHC: 32.6 g/dL (ref 30.0–36.0)
MCV: 90.4 fL (ref 80.0–100.0)
Monocytes Absolute: 0.2 K/uL (ref 0.1–1.0)
Monocytes Relative: 6 %
Neutro Abs: 2.1 K/uL (ref 1.7–7.7)
Neutrophils Relative %: 49 %
Platelets: 226 K/uL (ref 150–400)
RBC: 4.27 MIL/uL (ref 3.87–5.11)
RDW: 13.6 % (ref 11.5–15.5)
WBC: 4.1 K/uL (ref 4.0–10.5)
nRBC: 0 % (ref 0.0–0.2)

## 2024-11-04 LAB — COMPREHENSIVE METABOLIC PANEL WITH GFR
ALT: 13 U/L (ref 0–44)
AST: 22 U/L (ref 15–41)
Albumin: 3.4 g/dL — ABNORMAL LOW (ref 3.5–5.0)
Alkaline Phosphatase: 57 U/L (ref 38–126)
Anion gap: 9 (ref 5–15)
BUN: 17 mg/dL (ref 6–20)
CO2: 22 mmol/L (ref 22–32)
Calcium: 7.7 mg/dL — ABNORMAL LOW (ref 8.9–10.3)
Chloride: 109 mmol/L (ref 98–111)
Creatinine, Ser: 0.42 mg/dL — ABNORMAL LOW (ref 0.44–1.00)
GFR, Estimated: >60 mL/min
Glucose, Bld: 100 mg/dL — ABNORMAL HIGH (ref 70–99)
Potassium: 3.3 mmol/L — ABNORMAL LOW (ref 3.5–5.1)
Sodium: 140 mmol/L (ref 135–145)
Total Bilirubin: 0.2 mg/dL (ref 0.0–1.2)
Total Protein: 5.8 g/dL — ABNORMAL LOW (ref 6.5–8.1)

## 2024-11-04 LAB — LIPASE, BLOOD: Lipase: 30 U/L (ref 11–51)

## 2024-11-04 MED ORDER — ONDANSETRON 4 MG PO TBDP: 4.0000 mg | ORAL_TABLET | Freq: Three times a day (TID) | ORAL | 0 refills | Status: AC | PRN

## 2024-11-04 MED ORDER — ONDANSETRON HCL 4 MG/2ML IJ SOLN
4.0000 mg | Freq: Once | INTRAMUSCULAR | Status: AC
  Administered 2024-11-04: 4 mg via INTRAVENOUS
  Filled 2024-11-04: qty 2

## 2024-11-04 MED ORDER — SODIUM CHLORIDE 0.9 % IV BOLUS
500.0000 mL | Freq: Once | INTRAVENOUS | Status: AC
  Administered 2024-11-04: 500 mL via INTRAVENOUS

## 2024-11-04 MED ORDER — KETOROLAC TROMETHAMINE 15 MG/ML IJ SOLN
15.0000 mg | Freq: Once | INTRAMUSCULAR | Status: AC
  Administered 2024-11-04: 15 mg via INTRAVENOUS
  Filled 2024-11-04: qty 1

## 2024-11-04 MED ORDER — HYDROMORPHONE HCL 1 MG/ML IJ SOLN
0.5000 mg | Freq: Once | INTRAMUSCULAR | Status: AC
  Administered 2024-11-04: 0.5 mg via INTRAVENOUS
  Filled 2024-11-04: qty 0.5

## 2024-11-04 MED ORDER — CALCIUM GLUCONATE-NACL 1-0.675 GM/50ML-% IV SOLN
1.0000 g | Freq: Once | INTRAVENOUS | Status: AC
  Administered 2024-11-04: 1000 mg via INTRAVENOUS
  Filled 2024-11-04: qty 50

## 2024-11-04 MED ORDER — OXYCODONE-ACETAMINOPHEN 5-325 MG PO TABS: 1.0000 | ORAL_TABLET | ORAL | 0 refills | Status: AC | PRN

## 2024-11-04 MED ORDER — IOHEXOL 300 MG/ML  SOLN
100.0000 mL | Freq: Once | INTRAMUSCULAR | Status: AC | PRN
  Administered 2024-11-04: 100 mL via INTRAVENOUS

## 2024-11-04 MED ORDER — TAMSULOSIN HCL 0.4 MG PO CAPS
0.4000 mg | ORAL_CAPSULE | Freq: Once | ORAL | Status: AC
  Administered 2024-11-04: 0.4 mg via ORAL
  Filled 2024-11-04: qty 1

## 2024-11-04 MED ORDER — SODIUM CHLORIDE 0.9 % IV BOLUS
1000.0000 mL | Freq: Once | INTRAVENOUS | Status: AC
  Administered 2024-11-04: 1000 mL via INTRAVENOUS

## 2024-11-04 MED ORDER — TAMSULOSIN HCL 0.4 MG PO CAPS: 0.4000 mg | ORAL_CAPSULE | Freq: Every day | ORAL | 0 refills | Status: AC

## 2024-11-04 NOTE — ED Provider Notes (Signed)
 "  [facility] Provider Note    Event Date/Time   First MD Initiated Contact with Patient 11/04/24 0403     (approximate)   History   Flank Pain   HPI  NILI HONDA is a 54 y.o. female anxiety, prediabetes, hyperlipidemia, gastric sleeve who presents with 3 hours of a dysuria, urinary hesitancy and right upper quadrant and right flank pain.  Patient reports that she went to bed in her normal state of health and got up to urinate.  She felt as if she could not get the urine out and went back to bed but is subsequently woken up again and on the second time was still not able to urinate and developed right-sided flank and abdominal pain.  Patient has no prior history of nephrolithiasis and this pain is like nothing she has experienced previously.  She rates the pain as 10 out of 10.  There is nausea without any emesis.  Denies any change in bowel habits.  She presents with her significant other who contributes to the history     Physical Exam   Triage Vital Signs: ED Triage Vitals  Encounter Vitals Group     BP 11/04/24 0407 138/71     Girls Systolic BP Percentile --      Girls Diastolic BP Percentile --      Boys Systolic BP Percentile --      Boys Diastolic BP Percentile --      Pulse Rate 11/04/24 0406 74     Resp 11/04/24 0406 19     Temp 11/04/24 0406 98.1 F (36.7 C)     Temp Source 11/04/24 0406 Oral     SpO2 11/04/24 0406 99 %     Weight --      Height 11/04/24 0407 5' 3 (1.6 m)     Head Circumference --      Peak Flow --      Pain Score 11/04/24 0408 10     Pain Loc --      Pain Education --      Exclude from Growth Chart --     Most recent vital signs: Vitals:   11/04/24 0406 11/04/24 0407  BP:  138/71  Pulse: 74   Resp: 19   Temp: 98.1 F (36.7 C)   SpO2: 99%     Nursing Triage Note reviewed. Vital signs reviewed and patients oxygen saturation is normoxic  General: Patient is well nourished, well developed, awake and  alert, appears uncomfortable Head: Normocephalic and atraumatic Eyes: Normal inspection, extraocular muscles intact, no conjunctival pallor Ear, nose, throat: Normal external exam Neck: Normal range of motion Respiratory: Patient is in no respiratory distress, lungs CTAB Cardiovascular: Patient is not tachycardic, RRR without murmur appreciated GI: Abd SNT with no guarding or rebound  Back: Normal inspection of the back with good strength and range of motion throughout all ext Extremities: pulses intact with good cap refills, no LE pitting edema or calf tenderness Neuro: The patient is alert and oriented to person, place, and time, appropriately conversive, with 5/5 bilat UE/LE strength, no gross motor or sensory defects noted. Coordination appears to be adequate. Skin: Warm, dry, and intact Psych: normal mood and affect, no SI or HI  ED Results / Procedures / Treatments   Labs (all labs ordered are listed, but only abnormal results are displayed) Labs Reviewed  COMPREHENSIVE METABOLIC PANEL WITH GFR - Abnormal; Notable for the following components:      Result  Value   Potassium 3.3 (*)    Glucose, Bld 100 (*)    Creatinine, Ser 0.42 (*)    Calcium  7.7 (*)    Total Protein 5.8 (*)    Albumin 3.4 (*)    All other components within normal limits  URINALYSIS, COMPLETE (UACMP) WITH MICROSCOPIC - Abnormal; Notable for the following components:   Color, Urine YELLOW (*)    APPearance HAZY (*)    Leukocytes,Ua SMALL (*)    Bacteria, UA RARE (*)    All other components within normal limits  CBC WITH DIFFERENTIAL/PLATELET  LIPASE, BLOOD     EKG EKG and rhythm strip are interpreted by myself:   EKG: [Normal sinus rhythm] at heart rate of 64, normal QRS duration, QTc 416, nonspecific ST segments and T waves no ectopy EKG not consistent with Acute STEMI Rhythm strip: NSR in lead II   RADIOLOGY CT abd and pelvis with iv contrast: Patient has a small stone in the right UVJ on my  independent review interpretation radiologist agrees and right mild hydronephrosis    PROCEDURES:  Critical Care performed: No  Procedures   MEDICATIONS ORDERED IN ED: Medications  calcium  gluconate 1 g/ 50 mL sodium chloride  IVPB (1,000 mg Intravenous New Bag/Given 11/04/24 0542)  sodium chloride  0.9 % bolus 1,000 mL (1,000 mLs Intravenous New Bag/Given 11/04/24 0437)  sodium chloride  0.9 % bolus 500 mL (500 mLs Intravenous New Bag/Given 11/04/24 0436)  ondansetron  (ZOFRAN ) injection 4 mg (4 mg Intravenous Given 11/04/24 0438)  HYDROmorphone  (DILAUDID ) injection 0.5 mg (0.5 mg Intravenous Given 11/04/24 0437)  ketorolac  (TORADOL ) 15 MG/ML injection 15 mg (15 mg Intravenous Given 11/04/24 0438)  iohexol  (OMNIPAQUE ) 300 MG/ML solution 100 mL (100 mLs Intravenous Contrast Given 11/04/24 0548)  tamsulosin  (FLOMAX ) capsule 0.4 mg (0.4 mg Oral Given 11/04/24 0626)     IMPRESSION / MDM / ASSESSMENT AND PLAN / ED COURSE                                Differential diagnosis includes, but is not limited to: Nephrolithiasis, internal hernia, perinephric abscess, electrolyte derangement, pyelonephritis   ED course: Patient presents writhing in pain but abdomen demonstrates no evidence of acute peritonitis.  Patient's symptoms improved after IV fluids Dilaudid  Zofran  ketorolac  and Flomax .  Patient did have a low calcium  and this was repleted.  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  CT abdomen pelvis demonstrated small stone and urinalysis does not seem consistent with UTI.  Patient's symptoms improved and she feels comfortable returning home.  Will send her with scripts for Percocet Zofran  and Flomax .  She will follow-up with her primary care physician   Clinical Course as of 11/04/24 0634  Sat Nov 04, 2024  0451 Urinalysis, Complete w Microscopic -Urine, Clean Catch(!) Inconclusive for UTI she does have  some leukocytosis and will bacteria but no nitrates, no RBCs [HD]  0451 CBC with Differential No leukocytosis [HD]  0504 Calcium (!): 7.7 This is low will replete [HD]  0620 Creatinine(!): 0.42 No acute renal insufficiency [HD]  0624 Patient is much improved and repeat exam is completely benign.  She feels comfortable returning home [HD]  [phone] Will send over prescription for Flomax  Percocet and she will follow-up with her primary care physician.  She was counseled on the incidental findings on her CT  scan including the bilateral adrenal nodules [HD]    Clinical Course User Index [HD] Nicholaus Rolland BRAVO, MD    At time of discharge there is no evidence of acute life, limb, vision, or fertility threat. Patient has stable vital signs, pain is well controlled, patient is ambulatory and p.o. tolerant.  Discharge instructions were completed using the EPIC system. I would refer you to those at this time. All warnings prescriptions follow-up etc. were discussed in detail with the patient. Patient indicates understanding and is agreeable with this plan. All questions answered.  Patient is made aware that they may return to the emergency department for any worsening or new condition or for any other emergency.   -- Risk: 5 This patient has a high risk of morbidity due to further diagnostic testing or treatment. Rationale: This patients evaluation and management involve a high risk of morbidity due to the potential severity of presenting symptoms, need for diagnostic testing, and/or initiation of treatment that may require close monitoring. The differential includes conditions with potential for significant deterioration or requiring escalation of care. Treatment decisions in the ED, including medication administration, procedural interventions, or disposition planning, reflect this level of risk. COPA: 5 The patient has the following acute or chronic illness/injury that poses a possible threat to life or  bodily function: [X] : The patient has a potentially serious acute condition or an acute exacerbation of a chronic illness requiring urgent evaluation and management in the Emergency Department. The clinical presentation necessitates immediate consideration of life-threatening or function-threatening diagnoses, even if they are ultimately ruled out.   FINAL CLINICAL IMPRESSION(S) / ED DIAGNOSES   Final diagnoses:  Nephrolithiasis  Right flank pain  Hypocalcemia     Rx / DC Orders   ED Discharge Orders          Ordered    tamsulosin  (FLOMAX ) 0.4 MG CAPS capsule  Daily        11/04/24 0627    ondansetron  (ZOFRAN -ODT) 4 MG disintegrating tablet  Every 8 hours PRN        11/04/24 0627    oxyCODONE -acetaminophen  (PERCOCET) 5-325 MG tablet  Every 4 hours PRN        11/04/24 9372             Note:  This document was prepared using Dragon voice recognition software and may include unintentional dictation errors.   Nicholaus Rolland BRAVO, MD 11/04/24 [phone]  "

## 2024-11-04 NOTE — Discharge Instructions (Signed)

## 2024-11-04 NOTE — ED Triage Notes (Signed)
 Pt arrived by PV. She says pain started about 2 hours ago.  Pain with urination and urge to urinate multiple times. Denies Fever, N/V.

## 2024-11-06 ENCOUNTER — Encounter: Payer: Self-pay | Admitting: Physician Assistant

## 2024-11-06 ENCOUNTER — Ambulatory Visit: Admitting: Physician Assistant

## 2024-11-06 VITALS — BP 128/70 | HR 53 | Temp 98.0°F | Resp 16 | Ht 63.0 in | Wt 190.4 lb

## 2024-11-06 DIAGNOSIS — J069 Acute upper respiratory infection, unspecified: Secondary | ICD-10-CM

## 2024-11-06 DIAGNOSIS — Z87442 Personal history of urinary calculi: Secondary | ICD-10-CM | POA: Diagnosis not present

## 2024-11-06 MED ORDER — AZITHROMYCIN 250 MG PO TABS: ORAL_TABLET | ORAL | 0 refills | Status: AC

## 2024-11-06 NOTE — Progress Notes (Signed)
 [facility] [address] [place], [place] [address]  Internal MEDICINE  Office Visit Note  Patient Name: Yolanda Reid  [id number]  [id number]  Date of Service: 11/06/2024  Chief Complaint  Patient presents with   Follow-up    E/D for kidney stone.   Shortness of Breath    SOB and Cough    HPI Pt is here for ED follow up -sharp pain on right side around 2 Am sat and difficulty urinating so went to ED -CT showed right renal stone with mild hydronephrosis. Also showed bilateral adrenal nodules that are likely benign and recommended a 47month adrenal protocol CT for monitoring -given fluids, flomax , pain meds, and calcium  (low calcium  on labs) which helped and was discharged with OP flomax , zofran , and pain meds, but pt did not pick these up yet. -She states she actually passed stone before walking out of ED though she had been discharged at this time, pain greatly improved and was able to fully urinate. States she visualized the stone and since stone passed does not feel she needs the meds prescribed. 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 E11.65   gabapentin (NEURONTIN) 300 MG capsule Take 300 mg by mouth at bedtime.   glucose blood (ACCU-CHEK GUIDE TEST) test strip USE AS INSTRUCTED ONCE DAILY   meloxicam  (MOBIC ) 15 MG tablet Take 1 tablet  (15 mg total) by mouth daily.   metFORMIN  (GLUCOPHAGE ) 1000 MG tablet Take 1 tablet (1,000 mg total) by mouth 2 (two) times daily.   omeprazole  (PRILOSEC) 20 MG capsule TAKE 1 CAPSULE (20 MG TOTAL) BY MOUTH 2 (TWO) TIMES DAILY BEFORE A MEAL.   ondansetron  (ZOFRAN ) 4 MG tablet Take 1 tablet (4 mg total) by mouth every 8 (eight) hours as needed for nausea or vomiting.   ondansetron  (ZOFRAN -ODT) 4 MG disintegrating tablet Take 1 tablet (4 mg total) by mouth every 8 (eight) hours as needed for nausea or vomiting.   oxyCODONE -acetaminophen  (PERCOCET) 5-325 MG tablet Take 1 tablet by mouth every 4 (four) hours as needed for up to 5 days for severe pain (pain score 7-10).   rosuvastatin  (CRESTOR ) 5 MG tablet Take 1 tablet by mouth 2x per week   tamsulosin  (FLOMAX ) 0.4 MG CAPS capsule Take 1 capsule (0.4 mg total) by mouth daily for 14 days.   No facility-administered encounter medications on file as of 11/06/2024.    Surgical History: Past Surgical History:  Procedure Laterality Date   ABDOMINAL HYSTERECTOMY  2000   BREAST SURGERY  1994   CESAREAN SECTION  1995   COLONOSCOPY N/A 06/08/2024   Procedure: COLONOSCOPY;  Surgeon: Jinny Carmine, MD;  Location: [facility] ENDOSCOPY;  Service: Endoscopy;  Laterality: N/A;  PATIENT SAYS SHE DOES NOT NEED INTERPRETER   LAPAROSCOPIC GASTRIC BAND REMOVAL WITH LAPAROSCOPIC GASTRIC SLEEVE RESECTION  2017   POLYPECTOMY  06/08/2024  Procedure: POLYPECTOMY, INTESTINE;  Surgeon: Jinny Carmine, MD;  Location: [facility] ENDOSCOPY;  Service: Endoscopy;;   SKIN BIOPSY  2010   Right butt cheek    Medical History: Past Medical History:  Diagnosis Date   Diabetes mellitus without complication (HCC)    Hyperlipidemia     Family History: Family History  Problem Relation Age of Onset   Arthritis Mother     Social History   Socioeconomic History   Marital status: Married    Spouse name: Not on file   Number of children: Not on file   Years of education: Not on file    Highest education level: Not on file  Occupational History   Not on file  Tobacco Use   Smoking status: Never   Smokeless tobacco: Never  Substance and Sexual Activity   Alcohol use: Not Currently   Drug use: Never   Sexual activity: Yes  Other Topics Concern   Not on file  Social History Narrative   Not on file   Social Drivers of Health   Tobacco Use: Low Risk (11/06/2024)   Patient History    Smoking Tobacco Use: Never    Smokeless Tobacco Use: Never    Passive Exposure: Not on file  Financial Resource Strain: Not on file  Food Insecurity: Not on file  Transportation Needs: Not on file  Physical Activity: Not on file  Stress: Not on file  Social Connections: Not on file  Intimate Partner Violence: Not on file  Depression (PHQ2-9): Low Risk (09/21/2024)   Depression (PHQ2-9)    PHQ-2 Score: 0  Alcohol Screen: Low Risk (01/06/2024)   Alcohol Screen    Last Alcohol Screening Score (AUDIT): 2  Housing: Unknown (09/26/2024)   Received from [facility]   Epic    Unable to Pay for Housing in the Last Year: Not on file    Number of Times Moved in the Last Year: Not on file    At any time in the past 12 months, were you homeless or living in a shelter (including now)?: Patient unable to answer  Utilities: Not on file  Health Literacy: Not on file      Review of Systems  Constitutional:  Positive for fatigue. Negative for chills and unexpected weight change.  HENT:  Positive for congestion, postnasal drip, sinus pressure and sinus pain.   Eyes:  Negative for redness.  Respiratory:  Positive for cough, shortness of breath and wheezing. Negative for chest tightness.   Cardiovascular:  Negative for chest pain and palpitations.  Gastrointestinal:  Negative for abdominal pain, constipation, diarrhea, nausea and vomiting.  Genitourinary:  Negative for difficulty urinating, dysuria, flank pain and frequency.  Musculoskeletal:  Positive for arthralgias. Negative  for back pain, joint swelling and neck pain.  Skin:  Negative for rash.  Neurological: Negative.  Negative for tremors and numbness.  Hematological:  Negative for adenopathy. Does not bruise/bleed easily.  Psychiatric/Behavioral:  Positive for sleep disturbance. Negative for behavioral problems (Depression) and suicidal ideas.     Vital Signs: BP 128/70   Pulse (!) 53   Temp 98 F (36.7 C)   Resp 16   Ht 5' 3 (1.6 m)   Wt 190 lb 6.4 oz (86.4 kg)   SpO2 96%   BMI 33.73 kg/m    Physical Exam Vitals and nursing note reviewed.  Constitutional:      General: She is not in acute distress.    Appearance: She is ill-appearing.  HENT:  Head: Normocephalic and atraumatic.  Eyes:     Extraocular Movements: Extraocular movements intact.  Cardiovascular:     Rate and Rhythm: Normal rate and regular rhythm.  Pulmonary:     Effort: Pulmonary effort is normal.     Breath sounds: Normal breath sounds. No wheezing.  Skin:    General: Skin is warm and dry.  Neurological:     General: No focal deficit present.     Mental Status: She is alert.  Psychiatric:        Mood and Affect: Mood normal.        Behavior: Behavior normal.        Assessment/Plan: 1. History of nephrolithiasis (Primary) Passed stone and symptoms have resolved completely  2. Upper respiratory tract infection, unspecified type Worsening symptoms, will treat with zpak and continue to hydrate and rest. Call if not improving - azithromycin  (ZITHROMAX ) 250 MG tablet; Take 2 tablets on day 1, then 1 tablet daily on days 2 through 5  Dispense: 6 tablet; Refill: 0  3. Low calcium  levels Will recheck levels since ED supplement and stone passing - Basic Metabolic Panel (BMET)   General Counseling: Charese verbalizes understanding of the findings of todays visit and agrees with plan of treatment. I have discussed any further diagnostic evaluation that may be needed or ordered today. We also reviewed her medications  today. she has been encouraged to call the office with any questions or concerns that should arise related to todays visit.    No orders of the defined types were placed in this encounter.   No orders of the defined types were placed in this encounter.   This patient was seen by Tinnie Pro, PA-C in collaboration with Dr. Sigrid Bathe as a part of collaborative care agreement.   Total time spent:30 Minutes Time spent includes review of chart, medications, test results, and follow up plan with the patient.      Dr Fozia M Khan Internal medicine

## 2025-01-08 ENCOUNTER — Encounter: Admitting: Physician Assistant

## 2025-01-18 ENCOUNTER — Ambulatory Visit: Admitting: Physician Assistant
